# Patient Record
Sex: Female | Born: 1950
Health system: Southern US, Community
[De-identification: ages and names within clinical notes are randomized; demographics above are authoritative.]

## PROBLEM LIST (undated history)

## (undated) DIAGNOSIS — I10 Essential (primary) hypertension: Secondary | ICD-10-CM

## (undated) DIAGNOSIS — H9193 Unspecified hearing loss, bilateral: Secondary | ICD-10-CM

## (undated) DIAGNOSIS — E119 Type 2 diabetes mellitus without complications: Secondary | ICD-10-CM

## (undated) HISTORY — PX: MASTECTOMY: SHX3

## (undated) HISTORY — DX: Unspecified hearing loss, bilateral: H91.93

---

## 1999-06-18 ENCOUNTER — Encounter: Admission: RE | Admit: 1999-06-18 | Discharge: 1999-06-18 | Payer: Self-pay | Admitting: Surgery

## 1999-06-18 ENCOUNTER — Encounter: Payer: Self-pay | Admitting: Surgery

## 1999-07-05 ENCOUNTER — Ambulatory Visit (HOSPITAL_BASED_OUTPATIENT_CLINIC_OR_DEPARTMENT_OTHER): Admission: RE | Admit: 1999-07-05 | Discharge: 1999-07-05 | Payer: Self-pay | Admitting: Surgery

## 1999-07-11 ENCOUNTER — Ambulatory Visit (HOSPITAL_COMMUNITY): Admission: RE | Admit: 1999-07-11 | Discharge: 1999-07-11 | Payer: Self-pay | Admitting: Surgery

## 1999-07-11 ENCOUNTER — Encounter: Payer: Self-pay | Admitting: Surgery

## 1999-07-11 ENCOUNTER — Other Ambulatory Visit: Admission: RE | Admit: 1999-07-11 | Discharge: 1999-07-11 | Payer: Self-pay | Admitting: Surgery

## 1999-07-31 ENCOUNTER — Ambulatory Visit: Admission: RE | Admit: 1999-07-31 | Discharge: 1999-07-31 | Payer: Self-pay | Admitting: Gynecology

## 1999-08-16 ENCOUNTER — Encounter: Payer: Self-pay | Admitting: Gynecologic Oncology

## 1999-08-21 ENCOUNTER — Encounter (INDEPENDENT_AMBULATORY_CARE_PROVIDER_SITE_OTHER): Payer: Self-pay

## 1999-08-21 ENCOUNTER — Ambulatory Visit (HOSPITAL_COMMUNITY): Admission: RE | Admit: 1999-08-21 | Discharge: 1999-08-21 | Payer: Self-pay | Admitting: Gynecologic Oncology

## 1999-09-04 ENCOUNTER — Ambulatory Visit: Admission: RE | Admit: 1999-09-04 | Discharge: 1999-09-04 | Payer: Self-pay | Admitting: Gynecologic Oncology

## 1999-10-31 ENCOUNTER — Ambulatory Visit: Admission: RE | Admit: 1999-10-31 | Discharge: 1999-10-31 | Payer: Self-pay | Admitting: Gynecologic Oncology

## 2000-10-01 ENCOUNTER — Other Ambulatory Visit: Admission: RE | Admit: 2000-10-01 | Discharge: 2000-10-01 | Payer: Self-pay | Admitting: *Deleted

## 2001-12-10 ENCOUNTER — Other Ambulatory Visit: Admission: RE | Admit: 2001-12-10 | Discharge: 2001-12-10 | Payer: Self-pay | Admitting: Obstetrics and Gynecology

## 2004-05-31 ENCOUNTER — Encounter: Admission: RE | Admit: 2004-05-31 | Discharge: 2004-05-31 | Payer: Self-pay | Admitting: Family Medicine

## 2005-09-29 ENCOUNTER — Emergency Department (HOSPITAL_COMMUNITY): Admission: EM | Admit: 2005-09-29 | Discharge: 2005-09-29 | Payer: Self-pay | Admitting: Emergency Medicine

## 2005-12-07 ENCOUNTER — Encounter: Admission: RE | Admit: 2005-12-07 | Discharge: 2005-12-07 | Payer: Self-pay | Admitting: Orthopedic Surgery

## 2005-12-09 ENCOUNTER — Encounter: Admission: RE | Admit: 2005-12-09 | Discharge: 2006-03-09 | Payer: Self-pay | Admitting: Orthopedic Surgery

## 2006-03-10 ENCOUNTER — Encounter: Admission: RE | Admit: 2006-03-10 | Discharge: 2006-03-25 | Payer: Self-pay | Admitting: Orthopedic Surgery

## 2006-10-06 ENCOUNTER — Encounter: Admission: RE | Admit: 2006-10-06 | Discharge: 2006-10-06 | Payer: Self-pay | Admitting: Family Medicine

## 2006-11-11 ENCOUNTER — Other Ambulatory Visit: Admission: RE | Admit: 2006-11-11 | Discharge: 2006-11-11 | Payer: Self-pay | Admitting: Obstetrics and Gynecology

## 2006-11-11 ENCOUNTER — Encounter: Admission: RE | Admit: 2006-11-11 | Discharge: 2006-11-11 | Payer: Self-pay | Admitting: Family Medicine

## 2006-11-19 ENCOUNTER — Encounter: Admission: RE | Admit: 2006-11-19 | Discharge: 2006-11-19 | Payer: Self-pay | Admitting: Family Medicine

## 2006-11-19 ENCOUNTER — Encounter (INDEPENDENT_AMBULATORY_CARE_PROVIDER_SITE_OTHER): Payer: Self-pay | Admitting: Diagnostic Radiology

## 2006-12-03 ENCOUNTER — Encounter: Admission: RE | Admit: 2006-12-03 | Discharge: 2006-12-03 | Payer: Self-pay | Admitting: Family Medicine

## 2006-12-25 ENCOUNTER — Encounter: Admission: RE | Admit: 2006-12-25 | Discharge: 2006-12-25 | Payer: Self-pay | Admitting: Surgery

## 2006-12-25 ENCOUNTER — Encounter (INDEPENDENT_AMBULATORY_CARE_PROVIDER_SITE_OTHER): Payer: Self-pay | Admitting: Surgery

## 2006-12-25 ENCOUNTER — Ambulatory Visit (HOSPITAL_BASED_OUTPATIENT_CLINIC_OR_DEPARTMENT_OTHER): Admission: RE | Admit: 2006-12-25 | Discharge: 2006-12-25 | Payer: Self-pay | Admitting: Surgery

## 2007-01-06 ENCOUNTER — Ambulatory Visit: Payer: Self-pay | Admitting: Oncology

## 2007-01-13 LAB — CBC WITH DIFFERENTIAL/PLATELET
BASO%: 0.5 % (ref 0.0–2.0)
Eosinophils Absolute: 0.1 10*3/uL (ref 0.0–0.5)
LYMPH%: 29.5 % (ref 14.0–48.0)
MCHC: 35.3 g/dL (ref 32.0–36.0)
MONO#: 0.3 10*3/uL (ref 0.1–0.9)
NEUT#: 3 10*3/uL (ref 1.5–6.5)
Platelets: 228 10*3/uL (ref 145–400)
RBC: 4.56 10*6/uL (ref 3.70–5.32)
WBC: 4.8 10*3/uL (ref 3.9–10.0)
lymph#: 1.4 10*3/uL (ref 0.9–3.3)

## 2007-01-13 LAB — COMPREHENSIVE METABOLIC PANEL
ALT: 13 U/L (ref 0–35)
Albumin: 4.2 g/dL (ref 3.5–5.2)
CO2: 27 mEq/L (ref 19–32)
Calcium: 9.4 mg/dL (ref 8.4–10.5)
Chloride: 102 mEq/L (ref 96–112)
Glucose, Bld: 112 mg/dL — ABNORMAL HIGH (ref 70–99)
Potassium: 3.7 mEq/L (ref 3.5–5.3)
Sodium: 140 mEq/L (ref 135–145)
Total Bilirubin: 0.4 mg/dL (ref 0.3–1.2)
Total Protein: 7.5 g/dL (ref 6.0–8.3)

## 2007-01-14 ENCOUNTER — Ambulatory Visit: Admission: RE | Admit: 2007-01-14 | Discharge: 2007-03-25 | Payer: Self-pay | Admitting: Radiation Oncology

## 2007-02-27 ENCOUNTER — Ambulatory Visit: Payer: Self-pay | Admitting: Oncology

## 2007-05-21 ENCOUNTER — Ambulatory Visit: Payer: Self-pay | Admitting: Oncology

## 2007-05-21 LAB — CBC WITH DIFFERENTIAL/PLATELET
BASO%: 0.5 % (ref 0.0–2.0)
Eosinophils Absolute: 0.1 10*3/uL (ref 0.0–0.5)
LYMPH%: 32.5 % (ref 14.0–48.0)
MCHC: 34.9 g/dL (ref 32.0–36.0)
MONO#: 0.6 10*3/uL (ref 0.1–0.9)
NEUT#: 4.4 10*3/uL (ref 1.5–6.5)
Platelets: 219 10*3/uL (ref 145–400)
RBC: 4.78 10*6/uL (ref 3.70–5.32)
RDW: 12.9 % (ref 11.3–14.5)
WBC: 7.5 10*3/uL (ref 3.9–10.0)

## 2007-05-21 LAB — COMPREHENSIVE METABOLIC PANEL
ALT: 13 U/L (ref 0–35)
Albumin: 4.2 g/dL (ref 3.5–5.2)
Alkaline Phosphatase: 52 U/L (ref 39–117)
CO2: 23 mEq/L (ref 19–32)
Potassium: 3.6 mEq/L (ref 3.5–5.3)
Sodium: 138 mEq/L (ref 135–145)
Total Bilirubin: 0.4 mg/dL (ref 0.3–1.2)
Total Protein: 7.8 g/dL (ref 6.0–8.3)

## 2007-08-11 ENCOUNTER — Ambulatory Visit: Payer: Self-pay | Admitting: Oncology

## 2007-08-21 LAB — CBC WITH DIFFERENTIAL/PLATELET
Basophils Absolute: 0 10*3/uL (ref 0.0–0.1)
EOS%: 1.6 % (ref 0.0–7.0)
Eosinophils Absolute: 0.1 10*3/uL (ref 0.0–0.5)
HCT: 37.2 % (ref 34.8–46.6)
HGB: 13.2 g/dL (ref 11.6–15.9)
MCH: 31.7 pg (ref 26.0–34.0)
MONO#: 0.4 10*3/uL (ref 0.1–0.9)
NEUT#: 2.6 10*3/uL (ref 1.5–6.5)
NEUT%: 52.8 % (ref 39.6–76.8)
lymph#: 1.8 10*3/uL (ref 0.9–3.3)

## 2007-08-21 LAB — COMPREHENSIVE METABOLIC PANEL
Albumin: 3.9 g/dL (ref 3.5–5.2)
BUN: 14 mg/dL (ref 6–23)
CO2: 27 mEq/L (ref 19–32)
Calcium: 9.2 mg/dL (ref 8.4–10.5)
Chloride: 102 mEq/L (ref 96–112)
Creatinine, Ser: 1.17 mg/dL (ref 0.40–1.20)
Glucose, Bld: 135 mg/dL — ABNORMAL HIGH (ref 70–99)
Potassium: 3.8 mEq/L (ref 3.5–5.3)

## 2007-08-21 LAB — CANCER ANTIGEN 27.29: CA 27.29: 11 U/mL (ref 0–39)

## 2007-08-27 LAB — VITAMIN D 25 HYDROXY (VIT D DEFICIENCY, FRACTURES): Vit D, 25-Hydroxy: 45 ng/mL (ref 30–89)

## 2007-08-27 LAB — TSH: TSH: 4.932 u[IU]/mL (ref 0.350–5.500)

## 2007-09-01 LAB — VITAMIN D 1,25 DIHYDROXY: Vit D, 1,25-Dihydroxy: 35 pg/mL (ref 15–75)

## 2007-11-23 ENCOUNTER — Ambulatory Visit: Payer: Self-pay | Admitting: Oncology

## 2007-12-02 ENCOUNTER — Encounter: Admission: RE | Admit: 2007-12-02 | Discharge: 2007-12-02 | Payer: Self-pay | Admitting: Oncology

## 2007-12-22 ENCOUNTER — Other Ambulatory Visit: Admission: RE | Admit: 2007-12-22 | Discharge: 2007-12-22 | Payer: Self-pay | Admitting: Obstetrics and Gynecology

## 2008-03-11 ENCOUNTER — Ambulatory Visit: Payer: Self-pay | Admitting: Oncology

## 2008-06-13 ENCOUNTER — Ambulatory Visit: Payer: Self-pay | Admitting: Oncology

## 2008-07-22 LAB — CBC WITH DIFFERENTIAL/PLATELET
BASO%: 0.7 % (ref 0.0–2.0)
Basophils Absolute: 0 10*3/uL (ref 0.0–0.1)
HGB: 13.9 g/dL (ref 11.6–15.9)
LYMPH%: 38.2 % (ref 14.0–49.7)
MCH: 32.1 pg (ref 25.1–34.0)
MCHC: 35.2 g/dL (ref 31.5–36.0)
MCV: 91.1 fL (ref 79.5–101.0)
NEUT#: 2.4 10*3/uL (ref 1.5–6.5)
NEUT%: 48.9 % (ref 38.4–76.8)
RDW: 12.6 % (ref 11.2–14.5)
WBC: 5 10*3/uL (ref 3.9–10.3)
lymph#: 1.9 10*3/uL (ref 0.9–3.3)

## 2008-07-22 LAB — COMPREHENSIVE METABOLIC PANEL
ALT: 16 U/L (ref 0–35)
Alkaline Phosphatase: 45 U/L (ref 39–117)
Total Bilirubin: 0.5 mg/dL (ref 0.3–1.2)

## 2008-07-25 LAB — CANCER ANTIGEN 27.29: CA 27.29: 7 U/mL (ref 0–39)

## 2008-07-25 LAB — VITAMIN D 25 HYDROXY (VIT D DEFICIENCY, FRACTURES): Vit D, 25-Hydroxy: 30 ng/mL (ref 30–89)

## 2008-12-02 ENCOUNTER — Encounter: Admission: RE | Admit: 2008-12-02 | Discharge: 2008-12-02 | Payer: Self-pay | Admitting: Oncology

## 2009-07-13 ENCOUNTER — Ambulatory Visit: Payer: Self-pay | Admitting: Oncology

## 2009-08-14 ENCOUNTER — Ambulatory Visit: Payer: Self-pay | Admitting: Oncology

## 2009-09-06 LAB — URINALYSIS, MICROSCOPIC - CHCC
Bilirubin (Urine): NEGATIVE
Ketones: NEGATIVE mg/dL
Nitrite: NEGATIVE
Protein: NEGATIVE mg/dL
pH: 7 (ref 4.6–8.0)

## 2009-09-06 LAB — CBC WITH DIFFERENTIAL/PLATELET
EOS%: 0.9 % (ref 0.0–7.0)
Eosinophils Absolute: 0.1 10*3/uL (ref 0.0–0.5)
MCHC: 34.4 g/dL (ref 31.5–36.0)
MCV: 92.2 fL (ref 79.5–101.0)
Platelets: 217 10*3/uL (ref 145–400)
RBC: 4.56 10*6/uL (ref 3.70–5.45)

## 2009-09-07 LAB — COMPREHENSIVE METABOLIC PANEL
Albumin: 4.3 g/dL (ref 3.5–5.2)
Alkaline Phosphatase: 61 U/L (ref 39–117)
Chloride: 102 mEq/L (ref 96–112)
Glucose, Bld: 104 mg/dL — ABNORMAL HIGH (ref 70–99)
Potassium: 3.6 mEq/L (ref 3.5–5.3)

## 2009-09-07 LAB — URINE CULTURE

## 2009-09-07 LAB — CANCER ANTIGEN 27.29: CA 27.29: 11 U/mL (ref 0–39)

## 2009-11-08 ENCOUNTER — Encounter: Admission: RE | Admit: 2009-11-08 | Discharge: 2009-11-08 | Payer: Self-pay | Admitting: Oncology

## 2010-05-26 ENCOUNTER — Encounter: Payer: Self-pay | Admitting: Family Medicine

## 2010-05-27 ENCOUNTER — Encounter: Payer: Self-pay | Admitting: Oncology

## 2010-09-18 NOTE — Op Note (Signed)
NAMETAUSHA, MILHOAN                   ACCOUNT NO.:  [id number]   MEDICAL RECORD NO.:  [id number]          PATIENT TYPE:  AMB   LOCATION:  DSC                          FACILITY:  [facility]   PHYSICIAN:  Sandria Bales. Ezzard Standing, M.D.  DATE OF BIRTH:  05-25-1950   DATE OF PROCEDURE:  12/25/2006  DATE OF DISCHARGE:                               OPERATIVE REPORT   PREOPERATIVE DIAGNOSIS:  Ductal carcinoma in situ of the right breast at  the 11 o'clock position.   POSTOPERATIVE DIAGNOSIS:  Ductal carcinoma in situ of the right breast  at the 11 o'clock position.   PROCEDURE:  Wire localization of right breast with right breast  lumpectomy.   SURGEON:  Sandria Bales. Ezzard Standing, M.D.   No first assistant.   ANESTHESIA:  General endotracheal with laryngeal mask airway and 3 mL of  0.25% Marcaine.   COMPLICATIONS:  None.   INDICATIONS FOR PROCEDURE:  Ms. Hebb is a 60 year old white female who  is a patient of Rosanne Ashing Tomlison of [place] who has had a recent  mammogram showing microcalcifications upper outer quadrant of the right  breast.  She underwent a core biopsy which showed a ductal carcinoma in  situ.  She had an MRI which suggested this area of her breast may be  somewhat larger such as 3 cm and what was imaged on mammography and she  now comes for lumpectomy to excise this area of cancer.   The indications and potential complications were explained to the  patient.  The potential complications are bleeding, infection, inability  to excise the entire tumor and the possibility of recurrence of the  tumor.   The patient is deaf and this was done both in direct visually reading my  lips and with an interpreter.   OPERATIVE NOTE:  The patient had 2 needles placed in her right breast at about the 11:30  and 12 o'clock position coming out cranial from the areola.  These were  placed at the breast center.  It appeared that the tumors which were  being marked were at about the 11 to 11:30 position  and the right breast  may be 2 or 3 cm off the areola.   She presented to the OR where she had a general anesthesia.  A time out  was held identifying the patient and procedure.  I prepped the breast  with betadine and sterilely draped the breast.  I made an elliptical  incision including skin and then excised the breast down to the chest  wall.  I took out a block of breast tissue approximately 7 or 8 cm in  diameter which included both wires.   I then shot a specimen mammograms using the Faxitron mammography machine  and I spoke to Dr. Mickie Hillier regarding the specimen.  We confirmed  both wires were in the specimen as were the clip and  microcalcifications.   Because of the size of the specimen and the findings, we decided to go  and just do permanent analysis of the specimen without even trying touch  preps.  There was no gross or obvious disease that I thought I cut  through during my resection.   I then irrigated the wound with saline, closed the subcutaneous tissues  with 3-0 Vicryl suture, the skin with a 5-0 Vicryl suture, painted the  wound with tincture of benzoin and Steri-Stripped it.   The patient tolerated the procedure well, was transported to the  recovery room in good condition.  Sponge and needle count were correct  at the end of the case.      Sandria Bales. Ezzard Standing, M.D.  Electronically Signed     DHN/MEDQ  D:  12/25/2006  T:  12/25/2006  Job:  [id number]   cc:   Rosine Abe, PA  Ladona Horns. Mariel Sleet, MD

## 2010-09-21 NOTE — H&P (Signed)
[facility]  Patient:    Alyssa Mitchell, Alyssa Mitchell                          MRN: [id number] Adm. Date:  [id number] Disc. Date: [id number] Attending:  Ronita Hipps [place]unn[place]liff CC:         Willey Blade, M.D.             Abigail Miyamoto, M.D.             Telford Nab, N.P. - Gyn Oncology Program - [facility]                         History and Physical  CHIEF COMPLAINT:  Ms. Villacres returns for a re-evaluation of her vulva.  HISTORY OF PRESENT ILLNESS:  She underwent a vulvar exploration on August 21, 1999, with evacuation of a benign organizing hematoma and fragments of synthetic mesh, without any significant pathology identified.  She has had progressive healing nd notes marked improvement in her vulvar sensation at rest, exercise, and during intercourse.  She has no questions and appears quite happy.  She does note that she has begun regular menses since surgery.  She and her husband are considering a vasectomy.  PHYSICAL EXAMINATION:  VITAL SIGNS:  Stable and afebrile.  EXTREMITIES:  Left groin reveals a well-healed incision.  There is minimal induration under the incision.  GENITOURINARY:  Examination of the vulva reveals a well-healed suture line, very cosmetic, inapparent amongst the pubic hair.  There is on underlying mass or induration and no tenderness.  ASSESSMENT:  Vulvar cyst or hematoma resolved.  PLAN:  The patient is released from followup from our clinic.  She will follow p with Dr. Willey Blade, for routine examinations on an annual basis.  We discussed contraceptive options, and she and her husband may well pursue vasectomy. DD:  10/31/99 TD:  10/31/99 Job: [id number]

## 2010-09-21 NOTE — Discharge Summary (Signed)
[facility]  Patient:    Alyssa Mitchell, Alyssa Mitchell                          MRN: [id number] Adm. Date:  [id number] Disc. Date: [id number] Attending:  Ronita Hipps Tunnicliff CC:         Willey Blade, M.D.             Abigail Miyamoto, M.D.             Kerby Moors, N.N. G1 Oncology Program [facility]                           Discharge Summary  HISTORY:  [name] returns for reevaluation of her vulva.  Since she was last seen by Dr. Stanford Breed one week ago, she has noted continued slight discomfort and swelling of the left vulva.  She is on a course of antibiotics for urinary tract infection and notes cough, slight hoarseness, and dysphagia, following intubation for her vulvar surgery.  It should be noted that she had probable laryngeal edema, treated by anesthesia in the recovery room and was released by them on the day f surgery.  It was expected that her symptoms of dysphagia and cough would improve. She is using steroid ointment along the incision line.  Exam:  Vital signs stable and afebrile.  There is minimal edema of the left vulva. The suture line is intact.  There is deep induration medially adjacent to her inguinal herniorrhaphy mesh.  There is a small patch of denuded skin in the medial left labia.  There is no underlying cystic component.  Pathology reveals benign organizing blood clot associated with fibroadipose tissue and synthetic mesh with inflammation, but no cyst wall identified.  ASSESSMENT:  Convalescing from second surgery in the same anatomic region.  By examination, she is making satisfactory progress.  Persistent dysphagia and cough, probably associated with laryngeal edema, relatively mildly symptomatic.  I again recommended that the patient use local heat and ibuprofen for her swelling. We discussed limitations to activities and I recommended that she return to work when she is able to sit and stand for prolonged periods of time,  which I would anticipate would take place within the next couple of weeks.  I would like to see her for followup in approximately 6-8 weeks, when she is completed healing. She was basically reassured regarding her recurrent status.  I recommended that if her symptoms of laryngeal edema do not improve that she be seen by an ENT physician and she was given several names and telephone numbers.  DD:  09/04/99 TD:  09/04/99 Job: [id number]

## 2010-09-21 NOTE — Op Note (Signed)
[place]  Patient:    [name], [name]                          MRN: [id number] Proc. Date: 07/05/99 Adm. Date:  [id number] Disc. Date: [id number] Attending:  Abigail Miyamoto A                           Operative Report  PREOPERATIVE DIAGNOSIS:  Left inguinal hernia.  POSTOPERATIVE DIAGNOSIS:  Left inguinal hernia.  OPERATION PERFORMED:  Left inguinal hernia repair.  SURGEON:  Abigail Miyamoto, M.D.  ANESTHESIA:  General endotracheal and 0.25% Marcaine plain.  INDICATIONS FOR PROCEDURE:  Ms. Alyssa Mitchell is a very pleasant 60 year old who presented with a bulge in her left labial area.  This area was easily reducible on physical examination.  She had been seen by her gynecologist who felt this represented a hernia rather than a cyst of some sort.  Preoperatively, the patient had an ultrasound performed of the groin which demonstrated findings consistent  with a loop of bowel and an inguinal hernia.  No specific cyst was identified.  Therefore a decision was made to proceed to the operating room for groin exploration and hernia repair.  DESCRIPTION OF PROCEDURE:  The patient was brought to the operating room and identified at [facility].  She was placed supine on the operating table and general anesthesia was induced.  Her abdomen was then prepped and draped in the usual sterile fashion.  Using a #10 blade, a small longitudinal incision was made in he patients right groin.  The incision was carried down through Scarpas fascia with the electrocautery.  External oblique fascia was then identified and opened completely.  The groin anatomy was identified including the round ligament. There was a question of a hernia tract going through the weakened inguinal floor and nto the labial area; however, this was difficult to ascertain and no specific loop f bowel was identified.  At this point, the ligament was transected and the proximal limb was  sewn off with silk suture.  Next, a piece of Prolene mesh was brought nto the field and fashioned appropriately.  It was then sewn into the inguinal area by tacking it to the pubic tubercle and sewing it around the transversalis fascia nd then along the shelving edge of the inguinal ligament.  A separate area was fashioned to go into the labial area covering any defect in that area as well. At this point once the mesh was sewn in place, the groin was irrigated with normal  saline.  The external oblique fascia was then closed with a running 2-0 Vicryl suture.  Scarpas fascia was then closed with interrupted 3-0  Vicryl sutures and the skin was closed with a running 4-0 Vicryl suture.  Steri-Strips, gauze and ape were then applied.  The patient tolerated the procedure well.  All sponge, needle and instrument counts were correct at the end of the procedure.  The patient was then extubated in the operating room and taken in stable condition to the recovery room. DD:  07/30/99 TD:  07/30/99 Job: [id number]

## 2011-02-15 LAB — I-STAT 8, (EC8 V) (CONVERTED LAB)
BUN: 11
Bicarbonate: 29 — ABNORMAL HIGH
Glucose, Bld: 115 — ABNORMAL HIGH
Hemoglobin: 15.3 — ABNORMAL HIGH
Sodium: 139
pH, Ven: 7.365 — ABNORMAL HIGH

## 2012-06-05 ENCOUNTER — Other Ambulatory Visit: Payer: Self-pay | Admitting: Physician Assistant

## 2012-06-05 DIAGNOSIS — Z853 Personal history of malignant neoplasm of breast: Secondary | ICD-10-CM

## 2012-09-16 ENCOUNTER — Ambulatory Visit
Admission: RE | Admit: 2012-09-16 | Discharge: 2012-09-16 | Disposition: A | Discharge status: Home / Self Care | Payer: Medicare Other | Source: Ambulatory Visit | Attending: Physician Assistant | Admitting: Physician Assistant

## 2012-09-16 DIAGNOSIS — Z853 Personal history of malignant neoplasm of breast: Secondary | ICD-10-CM

## 2014-12-13 ENCOUNTER — Emergency Department (HOSPITAL_COMMUNITY): Payer: Federal, State, Local not specified - PPO

## 2014-12-13 ENCOUNTER — Emergency Department (HOSPITAL_COMMUNITY)
Admission: EM | Admit: 2014-12-13 | Discharge: 2014-12-14 | Disposition: A | Discharge status: Home / Self Care | Payer: Federal, State, Local not specified - PPO | Attending: Emergency Medicine | Admitting: Emergency Medicine

## 2014-12-13 ENCOUNTER — Encounter (HOSPITAL_COMMUNITY): Payer: Self-pay | Admitting: Physical Medicine and Rehabilitation

## 2014-12-13 DIAGNOSIS — E119 Type 2 diabetes mellitus without complications: Secondary | ICD-10-CM | POA: Insufficient documentation

## 2014-12-13 DIAGNOSIS — R05 Cough: Secondary | ICD-10-CM | POA: Insufficient documentation

## 2014-12-13 DIAGNOSIS — R109 Unspecified abdominal pain: Secondary | ICD-10-CM

## 2014-12-13 DIAGNOSIS — R1032 Left lower quadrant pain: Secondary | ICD-10-CM | POA: Diagnosis present

## 2014-12-13 DIAGNOSIS — R1084 Generalized abdominal pain: Secondary | ICD-10-CM | POA: Diagnosis not present

## 2014-12-13 DIAGNOSIS — N200 Calculus of kidney: Secondary | ICD-10-CM | POA: Diagnosis not present

## 2014-12-13 DIAGNOSIS — K5792 Diverticulitis of intestine, part unspecified, without perforation or abscess without bleeding: Secondary | ICD-10-CM | POA: Diagnosis not present

## 2014-12-13 DIAGNOSIS — I10 Essential (primary) hypertension: Secondary | ICD-10-CM | POA: Diagnosis not present

## 2014-12-13 DIAGNOSIS — K760 Fatty (change of) liver, not elsewhere classified: Secondary | ICD-10-CM | POA: Diagnosis not present

## 2014-12-13 DIAGNOSIS — R059 Cough, unspecified: Secondary | ICD-10-CM

## 2014-12-13 DIAGNOSIS — N390 Urinary tract infection, site not specified: Secondary | ICD-10-CM | POA: Insufficient documentation

## 2014-12-13 DIAGNOSIS — Z79899 Other long term (current) drug therapy: Secondary | ICD-10-CM | POA: Insufficient documentation

## 2014-12-13 DIAGNOSIS — K5732 Diverticulitis of large intestine without perforation or abscess without bleeding: Secondary | ICD-10-CM | POA: Diagnosis not present

## 2014-12-13 HISTORY — DX: Essential (primary) hypertension: I10

## 2014-12-13 HISTORY — DX: Type 2 diabetes mellitus without complications: E11.9

## 2014-12-13 LAB — COMPREHENSIVE METABOLIC PANEL
ALK PHOS: 68 U/L (ref 38–126)
ALT: 16 U/L (ref 14–54)
ANION GAP: 13 (ref 5–15)
AST: 21 U/L (ref 15–41)
Albumin: 3.7 g/dL (ref 3.5–5.0)
BUN: 11 mg/dL (ref 6–20)
CHLORIDE: 97 mmol/L — AB (ref 101–111)
CO2: 24 mmol/L (ref 22–32)
CREATININE: 0.98 mg/dL (ref 0.44–1.00)
Calcium: 9.9 mg/dL (ref 8.9–10.3)
GFR calc Af Amer: >60 mL/min (ref >60)
GLUCOSE: 113 mg/dL — AB (ref 65–99)
POTASSIUM: 4 mmol/L (ref 3.5–5.1)
SODIUM: 134 mmol/L — AB (ref 135–145)
Total Bilirubin: 0.8 mg/dL (ref 0.3–1.2)
Total Protein: 8.3 g/dL — ABNORMAL HIGH (ref 6.5–8.1)

## 2014-12-13 LAB — CBC WITH DIFFERENTIAL/PLATELET
BASOS PCT: 0 % (ref 0–1)
Basophils Absolute: 0 10*3/uL (ref 0.0–0.1)
EOS ABS: 0.1 10*3/uL (ref 0.0–0.7)
EOS PCT: 2 % (ref 0–5)
HEMATOCRIT: 41.4 % (ref 36.0–46.0)
HEMOGLOBIN: 14.3 g/dL (ref 12.0–15.0)
LYMPHS ABS: 2.6 10*3/uL (ref 0.7–4.0)
LYMPHS PCT: 32 % (ref 12–46)
MCH: 30.6 pg (ref 26.0–34.0)
MCHC: 34.5 g/dL (ref 30.0–36.0)
MCV: 88.5 fL (ref 78.0–100.0)
MONOS PCT: 8 % (ref 3–12)
Monocytes Absolute: 0.6 10*3/uL (ref 0.1–1.0)
NEUTROS PCT: 58 % (ref 43–77)
Neutro Abs: 4.7 10*3/uL (ref 1.7–7.7)
PLATELETS: 285 10*3/uL (ref 150–400)
RBC: 4.68 MIL/uL (ref 3.87–5.11)
RDW: 12.6 % (ref 11.5–15.5)
WBC: 8 10*3/uL (ref 4.0–10.5)

## 2014-12-13 LAB — URINE MICROSCOPIC-ADD ON

## 2014-12-13 LAB — URINALYSIS, ROUTINE W REFLEX MICROSCOPIC
Bilirubin Urine: NEGATIVE
GLUCOSE, UA: NEGATIVE mg/dL
Hgb urine dipstick: NEGATIVE
Ketones, ur: NEGATIVE mg/dL
Nitrite: NEGATIVE
PROTEIN: NEGATIVE mg/dL
SPECIFIC GRAVITY, URINE: 1.02 (ref 1.005–1.030)
Urobilinogen, UA: 1 mg/dL (ref 0.0–1.0)
pH: 6 (ref 5.0–8.0)

## 2014-12-13 LAB — LIPASE, BLOOD: Lipase: 32 U/L (ref 22–51)

## 2014-12-13 MED ORDER — MORPHINE SULFATE 4 MG/ML IJ SOLN
4.0000 mg | Freq: Once | INTRAMUSCULAR | Status: AC
  Administered 2014-12-13: 4 mg via INTRAVENOUS
  Filled 2014-12-13: qty 1

## 2014-12-13 MED ORDER — ONDANSETRON HCL 4 MG/2ML IJ SOLN
4.0000 mg | Freq: Once | INTRAMUSCULAR | Status: AC
  Administered 2014-12-13: 4 mg via INTRAVENOUS
  Filled 2014-12-13: qty 2

## 2014-12-13 MED ORDER — IOHEXOL 300 MG/ML  SOLN
100.0000 mL | Freq: Once | INTRAMUSCULAR | Status: AC | PRN
  Administered 2014-12-13: 100 mL via INTRAVENOUS

## 2014-12-13 MED ORDER — CIPROFLOXACIN HCL 500 MG PO TABS
500.0000 mg | ORAL_TABLET | Freq: Once | ORAL | Status: AC
  Administered 2014-12-14: 500 mg via ORAL
  Filled 2014-12-13: qty 1

## 2014-12-13 MED ORDER — CIPROFLOXACIN 500 MG/5ML (10%) PO SUSR: 500.0000 mg | Freq: Once | ORAL | Status: DC

## 2014-12-13 MED ORDER — ONDANSETRON 4 MG PO TBDP: 4.0000 mg | ORAL_TABLET | Freq: Three times a day (TID) | ORAL | Status: DC | PRN

## 2014-12-13 MED ORDER — METRONIDAZOLE 500 MG PO TABS
500.0000 mg | ORAL_TABLET | Freq: Once | ORAL | Status: AC
  Administered 2014-12-14: 500 mg via ORAL
  Filled 2014-12-13: qty 1

## 2014-12-13 MED ORDER — METRONIDAZOLE 500 MG PO TABS: 500.0000 mg | ORAL_TABLET | Freq: Four times a day (QID) | ORAL | Status: DC

## 2014-12-13 MED ORDER — CIPROFLOXACIN 500 MG/5ML (10%) PO SUSR: 500.0000 mg | Freq: Two times a day (BID) | ORAL | Status: DC

## 2014-12-13 MED ORDER — IOHEXOL 300 MG/ML  SOLN: 25.0000 mL | Freq: Once | INTRAMUSCULAR | Status: DC | PRN

## 2014-12-13 MED ORDER — SODIUM CHLORIDE 0.9 % IV BOLUS (SEPSIS)
1000.0000 mL | Freq: Once | INTRAVENOUS | Status: AC
  Administered 2014-12-13: 1000 mL via INTRAVENOUS

## 2014-12-13 NOTE — ED Notes (Signed)
Pt presents to department for evaluation of L sided abdominal pain radiating to lower back. Ongoing x1 week. Denies urinary symptoms. Pt is alert and oriented x4.

## 2014-12-13 NOTE — ED Provider Notes (Signed)
CSN: [id number]     Arrival date & time 12/13/14  1417 History   First MD Initiated Contact with Patient 12/13/14 1851     Chief Complaint  Patient presents with  . Abdominal Pain     (Consider location/radiation/quality/duration/timing/severity/associated sxs/prior Treatment) HPI    PCP: No primary care provider on file. Blood pressure 148/70, pulse 73, temperature 98 F (36.7 C), temperature source Oral, resp. rate 18, SpO2 95 %.  Alyssa Mitchell is a 64 y.o.female with a significant PMH of hypertension and diabetes.  LO is a 64 yo female with a PMh of HTN, DM and deafness who presents with left sided flank and lower abdominal pain x 1 week. The flank pain wraps around her side into the LLQ. It is minimally alleviated with gas or BM, but denies trying any medical treatment. She drinks multiple soft drinks per day. She denies a change in frequency, consistency or stool color. She denies urinary frequency, hesitancy or dysuria, fever. She was treated by her PCP last week with Bactrim DS without doing a urinalysis. The Bactrim has not alleviated the pain. She has a nonproductive cough and she denies SOB or chest pain. Pt reports not wanting to be evaluated for the cough today because she is not concerned with it.  The patient denies diaphoresis, fever, headache, weakness (general or focal), confusion, change of vision,  neck pain, dysphagia, aphagia, chest pain, shortness of breath,  back pain, nausea, vomiting, diarrhea, lower extremity swelling, rash.  Past Medical History  Diagnosis Date  . Hypertension   . Diabetes mellitus without complication    History reviewed. No pertinent past surgical history. No family history on file. Social History  Substance Use Topics  . Smoking status: Never Smoker   . Smokeless tobacco: None  . Alcohol Use: No   OB History    No data available     Review of Systems  10 Systems reviewed and are negative for acute change except as noted in the HPI.    Allergies  Review of patient's allergies indicates no known allergies.  Home Medications   Prior to Admission medications   Medication Sig Start Date End Date Taking? Authorizing Provider  atenolol-chlorthalidone (TENORETIC) 50-25 MG per tablet Take 1 tablet by mouth daily. 11/19/14  Yes Historical Provider, MD  atorvastatin (LIPITOR) 10 MG tablet Take 10 mg by mouth at bedtime. 11/01/14  Yes Historical Provider, MD  metFORMIN (GLUCOPHAGE) 1000 MG tablet Take 500 mg by mouth 2 (two) times daily. 09/19/14  Yes Historical Provider, MD  ciprofloxacin (CIPRO) 500 MG/5ML (10%) suspension Take 5 mLs (500 mg total) by mouth 2 (two) times daily. 12/13/14   Tayshun Gappa Carlota Raspberry, PA-C  metroNIDAZOLE (FLAGYL) 500 MG tablet Take 1 tablet (500 mg total) by mouth 4 (four) times daily. 12/13/14   Daizha Anand Carlota Raspberry, PA-C  ondansetron (ZOFRAN ODT) 4 MG disintegrating tablet Take 1 tablet (4 mg total) by mouth every 8 (eight) hours as needed for nausea or vomiting. 12/13/14   Hailea Eaglin Carlota Raspberry, PA-C   BP 152/65 mmHg  Pulse 69  Temp(Src) 98 F (36.7 C) (Oral)  Resp 18  SpO2 93% Physical Exam  Constitutional: She appears well-developed and well-nourished. No distress.  HENT:  Head: Normocephalic and atraumatic.  Eyes: Pupils are equal, round, and reactive to light.  Neck: Normal range of motion. Neck supple.  Cardiovascular: Normal rate and regular rhythm.   Pulmonary/Chest: Effort normal.  Abdominal: Soft. Bowel sounds are normal. She exhibits distension. There is tenderness (diffuse  mild ) in the periumbilical area and suprapubic area. There is no rebound, no guarding and no CVA tenderness.  Neurological: She is alert.  Skin: Skin is warm and dry.  Nursing note and vitals reviewed.   ED Course  Procedures (including critical care time) Labs Review Labs Reviewed  COMPREHENSIVE METABOLIC PANEL - Abnormal; Notable for the following:    Sodium 134 (*)    Chloride 97 (*)    Glucose, Bld 113 (*)    Total Protein  8.3 (*)    All other components within normal limits  URINALYSIS, ROUTINE W REFLEX MICROSCOPIC (NOT AT [facility]) - Abnormal; Notable for the following:    Color, Urine AMBER (*)    APPearance CLOUDY (*)    Leukocytes, UA LARGE (*)    All other components within normal limits  URINE MICROSCOPIC-ADD ON - Abnormal; Notable for the following:    Squamous Epithelial / LPF MANY (*)    Bacteria, UA FEW (*)    All other components within normal limits  URINE CULTURE  CBC WITH DIFFERENTIAL/PLATELET  LIPASE, BLOOD    Imaging Review Ct Abdomen Pelvis W Contrast  12/13/2014   CLINICAL DATA:  One week history of left lower quadrant region pain  EXAM: CT ABDOMEN AND PELVIS WITH CONTRAST  TECHNIQUE: Multidetector CT imaging of the abdomen and pelvis was performed using the standard protocol following bolus administration of intravenous contrast. Oral contrast was also administered.  CONTRAST:  160mL OMNIPAQUE IOHEXOL 300 MG/ML  SOLN  COMPARISON:  None.  FINDINGS: There is patchy bibasilar atelectasis. Lung bases are otherwise clear.  There is mild hepatic steatosis. No focal liver lesions are identified. Gallbladder wall is not appreciably thickened. There is no biliary duct dilatation.  Spleen, pancreas, and adrenals appear within normal limits.  There is a 1 x 1 cm cyst arising from the lateral left mid kidney. There is a 1.3 x 0.8 cm cyst in the anterior lower pole left kidney. There is a 7 mm area of decreased attenuation in the posterior mid right kidney which has a measured attenuation value higher than is expected with a simple cyst but does not show enhancement or increased attenuation on delayed images. This area may represent a mildly complex cyst. There is scarring in the anterior mid right kidney. There is no hydronephrosis on either side. There is a 1 mm calculus in the lower pole left kidney with a 2 mm calculus with adjacent 1 mm calculus more posterior in the lower pole left kidney. There is a 1 mm  calculus in the lower pole of the right kidney as well as several 1 mm calculi in the mid right kidney. There are no ureteral calculi on either side.  In the pelvis, the urinary bladder wall is not thickened. Uterus is anteverted.  There is no pelvic mass or pelvic fluid collection. There is wall thickening in the proximal sigmoid colon consistent with a degree of diverticulitis. There is inflammation of epiploic appendages in this region with secondary epiploic appendagitis. No abscess or microperforation is seen currently in this area.  Appendix appears normal.  No bowel obstruction.  No free air or portal venous air.  There is no ascites, adenopathy, or abscess in the abdomen or pelvis. There is atherosclerotic change in aorta but no demonstrable aneurysm. There is degenerative change in the lumbar spine with vacuum phenomenon at L5-S1. There are no blastic or lytic bone lesions.  IMPRESSION: Proximal sigmoid diverticulitis without demonstrable abscess or microperforation. There is  secondary epiploic appendagitis in this area.  Small nonobstructing calculi in each kidney. Small cysts in each kidney. There is a 7 mm area of decreased attenuation in the mid right kidney which cannot be classified as a simple cyst on this study. This finding may warrant nonemergent pre and post-contrast renal MR to further assess.  Mild hepatic steatosis.  No abscess.  No bowel obstruction.  Appendix appears normal.   Electronically Signed   By: Lowella Grip III M.D.   On: 12/13/2014 23:14     EKG Interpretation None      MDM   Final diagnoses:  Cough  Abdominal pain  Diverticulitis of intestine without perforation or abscess without bleeding  UTI (lower urinary tract infection)    Patient has possible UTI CBC, mildly abnormal CMP, pt received fluid replacement to correct electrolyte. Negative lipase Proximal sigmoid diverticulitis-- no abscess or microperforation. Benign non surgical epiploic appendagitis  is also seen here.  Otherwise the patient does not have any other acute findings. She is pain free currently in the ER. She has had no nausea, vomiting or diarrhea. No fever or weakness. She prefers home and to follow-up with PCP.  Rx: Cipro and flagyl (pt cannot swallow pills) and zofran. She declined rx for pain medication.  Medications  morphine 4 MG/ML injection 4 mg (4 mg Intravenous Given 12/13/14 2004)  ondansetron (ZOFRAN) injection 4 mg (4 mg Intravenous Given 12/13/14 2004)  sodium chloride 0.9 % bolus 1,000 mL (0 mLs Intravenous Stopped 12/14/14 0019)  iohexol (OMNIPAQUE) 300 MG/ML solution 100 mL (100 mLs Intravenous Contrast Given 12/13/14 2232)  metroNIDAZOLE (FLAGYL) tablet 500 mg (500 mg Oral Given 12/14/14 0018)  ciprofloxacin (CIPRO) tablet 500 mg (500 mg Oral Given 12/14/14 0018)    64 y.o.Shruthi M Sumpter's evaluation in the Emergency Department is complete. It has been determined that no acute conditions requiring further emergency intervention are present at this time. The patient/guardian have been advised of the diagnosis and plan. We have discussed signs and symptoms that warrant return to the ED, such as changes or worsening in symptoms.  Vital signs are stable at discharge. Filed Vitals:   12/14/14 0019  BP:   Pulse: 69  Temp:   Resp:     Patient/guardian has voiced understanding and agreed to follow-up with the PCP or specialist.    Delos Haring, PA-C 12/15/14 [place], MD 12/16/14 [phone]

## 2014-12-14 NOTE — Discharge Instructions (Signed)
Diverticulitis Diverticulitis is inflammation or infection of small pouches in your colon that form when you have a condition called diverticulosis. The pouches in your colon are called diverticula. Your colon, or large intestine, is where water is absorbed and stool is formed. Complications of diverticulitis can include:  Bleeding.  Severe infection.  Severe pain.  Perforation of your colon.  Obstruction of your colon. CAUSES  Diverticulitis is caused by bacteria. Diverticulitis happens when stool becomes trapped in diverticula. This allows bacteria to grow in the diverticula, which can lead to inflammation and infection. RISK FACTORS People with diverticulosis are at risk for diverticulitis. Eating a diet that does not include enough fiber from fruits and vegetables may make diverticulitis more likely to develop. SYMPTOMS  Symptoms of diverticulitis may include:  Abdominal pain and tenderness. The pain is normally located on the left side of the abdomen, but may occur in other areas.  Fever and chills.  Bloating.  Cramping.  Nausea.  Vomiting.  Constipation.  Diarrhea.  Blood in your stool. DIAGNOSIS  Your health care provider will ask you about your medical history and do a physical exam. You may need to have tests done because many medical conditions can cause the same symptoms as diverticulitis. Tests may include:  Blood tests.  Urine tests.  Imaging tests of the abdomen, including X-rays and CT scans. When your condition is under control, your health care provider may recommend that you have a colonoscopy. A colonoscopy can show how severe your diverticula are and whether something else is causing your symptoms. TREATMENT  Most cases of diverticulitis are mild and can be treated at home. Treatment may include:  Taking over-the-counter pain medicines.  Following a clear liquid diet.  Taking antibiotic medicines by mouth for 7-10 days. More severe cases may  be treated at a hospital. Treatment may include:  Not eating or drinking.  Taking prescription pain medicine.  Receiving antibiotic medicines through an IV tube.  Receiving fluids and nutrition through an IV tube.  Surgery. HOME CARE INSTRUCTIONS   Follow your health care provider's instructions carefully.  Follow a full liquid diet or other diet as directed by your health care provider. After your symptoms improve, your health care provider may tell you to change your diet. He or she may recommend you eat a high-fiber diet. Fruits and vegetables are good sources of fiber. Fiber makes it easier to pass stool.  Take fiber supplements or probiotics as directed by your health care provider.  Only take medicines as directed by your health care provider.  Keep all your follow-up appointments. SEEK MEDICAL CARE IF:   Your pain does not improve.  You have a hard time eating food.  Your bowel movements do not return to normal. SEEK IMMEDIATE MEDICAL CARE IF:   Your pain becomes worse.  Your symptoms do not get better.  Your symptoms suddenly get worse.  You have a fever.  You have repeated vomiting.  You have bloody or black, tarry stools. MAKE SURE YOU:   Understand these instructions.  Will watch your condition.  Will get help right away if you are not doing well or get worse. Document Released: 01/30/2005 Document Revised: 04/27/2013 Document Reviewed: 03/17/2013 [facility] Patient Information 2015 [place], [place]. This information is not intended to replace advice given to you by your health care provider. Make sure you discuss any questions you have with your health care provider.   Urinary Tract Infection Urinary tract infections (UTIs) can develop anywhere along your  urinary tract. Your urinary tract is your body's drainage system for removing wastes and extra water. Your urinary tract includes two kidneys, two ureters, a bladder, and a urethra. Your kidneys are a  pair of bean-shaped organs. Each kidney is about the size of your fist. They are located below your ribs, one on each side of your spine. CAUSES Infections are caused by microbes, which are microscopic organisms, including fungi, viruses, and bacteria. These organisms are so small that they can only be seen through a microscope. Bacteria are the microbes that most commonly cause UTIs. SYMPTOMS  Symptoms of UTIs may vary by age and gender of the patient and by the location of the infection. Symptoms in young women typically include a frequent and intense urge to urinate and a painful, burning feeling in the bladder or urethra during urination. Older women and men are more likely to be tired, shaky, and weak and have muscle aches and abdominal pain. A fever may mean the infection is in your kidneys. Other symptoms of a kidney infection include pain in your back or sides below the ribs, nausea, and vomiting. DIAGNOSIS To diagnose a UTI, your caregiver will ask you about your symptoms. Your caregiver also will ask to provide a urine sample. The urine sample will be tested for bacteria and white blood cells. White blood cells are made by your body to help fight infection. TREATMENT  Typically, UTIs can be treated with medication. Because most UTIs are caused by a bacterial infection, they usually can be treated with the use of antibiotics. The choice of antibiotic and length of treatment depend on your symptoms and the type of bacteria causing your infection. HOME CARE INSTRUCTIONS  If you were prescribed antibiotics, take them exactly as your caregiver instructs you. Finish the medication even if you feel better after you have only taken some of the medication.  Drink enough water and fluids to keep your urine clear or pale yellow.  Avoid caffeine, tea, and carbonated beverages. They tend to irritate your bladder.  Empty your bladder often. Avoid holding urine for long periods of time.  Empty your  bladder before and after sexual intercourse.  After a bowel movement, women should cleanse from front to back. Use each tissue only once. SEEK MEDICAL CARE IF:   You have back pain.  You develop a fever.  Your symptoms do not begin to resolve within 3 days. SEEK IMMEDIATE MEDICAL CARE IF:   You have severe back pain or lower abdominal pain.  You develop chills.  You have nausea or vomiting.  You have continued burning or discomfort with urination. MAKE SURE YOU:   Understand these instructions.  Will watch your condition.  Will get help right away if you are not doing well or get worse. Document Released: 01/30/2005 Document Revised: 10/22/2011 Document Reviewed: 05/31/2011 [facility] Patient Information 2015 [place], [place]. This information is not intended to replace advice given to you by your health care provider. Make sure you discuss any questions you have with your health care provider.

## 2014-12-16 LAB — URINE CULTURE

## 2016-05-22 ENCOUNTER — Ambulatory Visit: Payer: Self-pay | Admitting: Physician Assistant

## 2016-06-10 ENCOUNTER — Ambulatory Visit: Payer: Self-pay | Admitting: Pediatrics

## 2016-06-27 ENCOUNTER — Encounter (INDEPENDENT_AMBULATORY_CARE_PROVIDER_SITE_OTHER): Payer: Self-pay

## 2016-06-27 ENCOUNTER — Encounter: Payer: Self-pay | Admitting: Pediatrics

## 2016-06-27 ENCOUNTER — Telehealth: Payer: Self-pay | Admitting: Pediatrics

## 2016-06-27 ENCOUNTER — Ambulatory Visit (INDEPENDENT_AMBULATORY_CARE_PROVIDER_SITE_OTHER): Payer: Medicare Other | Admitting: Pediatrics

## 2016-06-27 VITALS — BP 160/70 | HR 73 | Temp 98.8°F | Ht 66.5 in | Wt 238.4 lb

## 2016-06-27 DIAGNOSIS — Z1231 Encounter for screening mammogram for malignant neoplasm of breast: Secondary | ICD-10-CM | POA: Diagnosis not present

## 2016-06-27 DIAGNOSIS — E119 Type 2 diabetes mellitus without complications: Secondary | ICD-10-CM | POA: Diagnosis not present

## 2016-06-27 DIAGNOSIS — I10 Essential (primary) hypertension: Secondary | ICD-10-CM

## 2016-06-27 DIAGNOSIS — E785 Hyperlipidemia, unspecified: Secondary | ICD-10-CM

## 2016-06-27 DIAGNOSIS — G4700 Insomnia, unspecified: Secondary | ICD-10-CM | POA: Diagnosis not present

## 2016-06-27 DIAGNOSIS — Z1211 Encounter for screening for malignant neoplasm of colon: Secondary | ICD-10-CM

## 2016-06-27 DIAGNOSIS — Z1239 Encounter for other screening for malignant neoplasm of breast: Secondary | ICD-10-CM

## 2016-06-27 DIAGNOSIS — Z780 Asymptomatic menopausal state: Secondary | ICD-10-CM

## 2016-06-27 DIAGNOSIS — Z23 Encounter for immunization: Secondary | ICD-10-CM | POA: Diagnosis not present

## 2016-06-27 DIAGNOSIS — E039 Hypothyroidism, unspecified: Secondary | ICD-10-CM

## 2016-06-27 DIAGNOSIS — Z6837 Body mass index (BMI) 37.0-37.9, adult: Secondary | ICD-10-CM

## 2016-06-27 LAB — BAYER DCA HB A1C WAIVED: HB A1C: 7.8 % — AB (ref <7.0)

## 2016-06-27 MED ORDER — ZOLPIDEM TARTRATE 5 MG PO TABS: 5.0000 mg | ORAL_TABLET | Freq: Every evening | ORAL | 4 refills | Status: DC | PRN

## 2016-06-27 MED ORDER — METFORMIN HCL 1000 MG PO TABS: 500.0000 mg | ORAL_TABLET | Freq: Two times a day (BID) | ORAL | 0 refills | Status: DC

## 2016-06-27 NOTE — Telephone Encounter (Signed)
What is the name of the medication? Alyssa Mitchell 500mg  twice a day.   Have you contacted your pharmacy to request a refill? No, she was just seen today and forgot this one  [facility] [place]    Patient notified that their request is being sent to the clinical staff for review and that they should receive a call once it is complete. If they do not receive a call within 24 hours they can check with their pharmacy or our office.

## 2016-06-27 NOTE — Progress Notes (Addendum)
Subjective:   Patient ID: Alyssa Mitchell, female    DOB: [birth year], 66 y.o.   MRN: [id number] CC: New Patient (Initial Visit) follow up multiple med problems  HPI: Alyssa Mitchell is a 66 y.o. female presenting for New Patient (Initial Visit)  She was born deaf, has a hearing aid in L ear.  DM2: Takes metformin twice a day, morning and night Eats two chocolate kisses a day BGLs low 100s when she checks  HTN: Thinks her BP is lower at home but doesn't check  Insomnia: Was taking ambien nightly Stopped when she ran out 3 months ago When on [place] she was able to fall asleep on time, had a good schedule during the day Has had a hard time sleeping since then, sometimes just a couple hours she thinks Wants to restart  HLD: Taking lovastatin, no side effects  Father with colon ca at 19yo Has never had colonoscopy Normal stooling, no blood, no constipation  Pt with h/o breast ca in her 76s, s/p mastectomy Has been getting regular pap smears No history of abnormals  Past Medical History:  Diagnosis Date  . Diabetes mellitus without complication ([place])   . Hearing impaired person, bilateral    Hearing Aid left Ear  . Hypertension    Family History  Problem Relation Age of Onset  . Cancer Mother   . Cancer Father   . Cancer Maternal Grandmother    Social History   Social History  . Marital status: Married    Spouse name: N/A  . Number of children: N/A  . Years of education: N/A   Social History Main Topics  . Smoking status: Never Smoker  . Smokeless tobacco: Never Used  . Alcohol use No  . Drug use: No  . Sexual activity: Not Asked   Other Topics Concern  . None   Social History Narrative  . None   ROS: All systems negative other than what is in HPI  Objective:    BP (!) 160/70   Pulse 73   Temp 98.8 F (37.1 C) (Oral)   Ht 5' 6.5" (1.689 m)   Wt 238 lb 6.4 oz (108.1 kg)   BMI 37.90 kg/m   Wt Readings from Last 3 Encounters:  06/27/16 238 lb 6.4 oz  (108.1 kg)    Gen: NAD, alert, cooperative with exam, NCAT EYES: EOMI, no conjunctival injection, or no icterus ENT:  R TM pearly gray, L ear with hearing aid, OP without erythema LYMPH: no cervical LAD CV: NRRR, normal S1/S2, no murmur, distal pulses 2+ b/l Resp: CTABL, no wheezes, normal WOB Abd: +BS, soft, NTND. no guarding or organomegaly Ext: No edema, warm Neuro: Alert and oriented, strength equal b/l UE and LE, coordination grossly normal  Assessment & Plan:  Alyssa Mitchell was seen today for new patient (initial visit).  Diagnoses and all orders for this visit:  Type 2 diabetes mellitus without complication, without long-term current use of insulin (HCC) Check A1c Cont metformin Pt to call for eye exam Foot exam next visit -     CBC with Differential/Platelet -     TSH -     Bayer DCA Hb A1c Waived -     Microalbumin / creatinine urine ratio  Insomnia, unspecified type Ongoing problem Pt very much wants to restart Lorrin Mais, has worked before Discussed sleep hygiene, ways to minimize ambien use Take ambien no more than twice a week Gave Rx with refills Must be seen for refills Can try  melatonin other nights -     zolpidem (AMBIEN) 5 MG tablet; Take 1 tablet (5 mg total) by mouth at bedtime as needed for sleep.  Screen for colon cancer No symptoms, dad with colon ca at 30yo -     Ambulatory referral to Gastroenterology  Screening for breast cancer H/o breast ca in 19s, no return -     MM Digital Screening; Future  Essential hypertension Likely not controlled Elevated today Says always higher at clinic but doesn't check at home  Check at home, RTC 2 weeks for recheck nurse visit Will increase meds if still high -     CMP14+EGFR  Hyperlipidemia, unspecified hyperlipidemia type -     Lipid panel  BMI 37.0-37.9, adult Discussed lifestyle changes Uses exercise bike regularly Minimize sugar intake  Post-menopausal Pt says she used to be on medication for bone  density, last scan over 4 yrs ago -     DG [facility] DEXA  Follow up plan: Return in about 3 months (around 09/24/2016) for DM2. Assunta Found, MD [facility]  ADDENDUM: TSH elevated, will start levothyroxine 172mg daily, recheck 8 weeks

## 2016-06-27 NOTE — Patient Instructions (Signed)
Call to schedule eye exam  I ordered mammogram, referral to gastroenterology for colonoscopy and bone density scan. You should be contacted about those appointments. Let me know if you havent heard within a week.  Come back for nurse visit in 2 weeks for blood pressure recheck If still elevated we may need to change your medicines

## 2016-06-27 NOTE — Telephone Encounter (Signed)
Prescription was sent to [place], patient notified

## 2016-06-28 DIAGNOSIS — E1159 Type 2 diabetes mellitus with other circulatory complications: Secondary | ICD-10-CM | POA: Insufficient documentation

## 2016-06-28 DIAGNOSIS — E039 Hypothyroidism, unspecified: Secondary | ICD-10-CM | POA: Insufficient documentation

## 2016-06-28 DIAGNOSIS — I10 Essential (primary) hypertension: Secondary | ICD-10-CM

## 2016-06-28 DIAGNOSIS — Z780 Asymptomatic menopausal state: Secondary | ICD-10-CM | POA: Insufficient documentation

## 2016-06-28 DIAGNOSIS — E119 Type 2 diabetes mellitus without complications: Secondary | ICD-10-CM | POA: Insufficient documentation

## 2016-06-28 DIAGNOSIS — G4700 Insomnia, unspecified: Secondary | ICD-10-CM | POA: Insufficient documentation

## 2016-06-28 DIAGNOSIS — E1169 Type 2 diabetes mellitus with other specified complication: Secondary | ICD-10-CM | POA: Insufficient documentation

## 2016-06-28 DIAGNOSIS — E785 Hyperlipidemia, unspecified: Secondary | ICD-10-CM

## 2016-06-28 DIAGNOSIS — Z6837 Body mass index (BMI) 37.0-37.9, adult: Secondary | ICD-10-CM | POA: Insufficient documentation

## 2016-06-28 LAB — CMP14+EGFR
ALT: 22 IU/L (ref 0–32)
AST: 24 IU/L (ref 0–40)
Albumin/Globulin Ratio: 1.5 (ref 1.2–2.2)
Albumin: 4.4 g/dL (ref 3.6–4.8)
Alkaline Phosphatase: 76 IU/L (ref 39–117)
BUN / CREAT RATIO: 17 (ref 12–28)
BUN: 15 mg/dL (ref 8–27)
Bilirubin Total: 0.4 mg/dL (ref 0.0–1.2)
CHLORIDE: 98 mmol/L (ref 96–106)
CO2: 25 mmol/L (ref 18–29)
Calcium: 10 mg/dL (ref 8.7–10.3)
Creatinine, Ser: 0.88 mg/dL (ref 0.57–1.00)
GFR calc Af Amer: 80 (ref >59)
GFR calc non Af Amer: 69 (ref >59)
GLOBULIN, TOTAL: 3 (ref 1.5–4.5)
Glucose: 149 mg/dL — ABNORMAL HIGH (ref 65–99)
Potassium: 4.3 mmol/L (ref 3.5–5.2)
SODIUM: 141 mmol/L (ref 134–144)
Total Protein: 7.4 g/dL (ref 6.0–8.5)

## 2016-06-28 LAB — MICROALBUMIN / CREATININE URINE RATIO
CREATININE, UR: 73.5 mg/dL
MICROALBUM., U, RANDOM: 35 ug/mL
Microalb/Creat Ratio: 47.6 mg/g creat — ABNORMAL HIGH (ref 0.0–30.0)

## 2016-06-28 LAB — CBC WITH DIFFERENTIAL/PLATELET
BASOS ABS: 0 10*3/uL (ref 0.0–0.2)
Basos: 0 %
EOS (ABSOLUTE): 0.2 10*3/uL (ref 0.0–0.4)
EOS: 3 %
HEMATOCRIT: 42.3 % (ref 34.0–46.6)
Hemoglobin: 14.1 g/dL (ref 11.1–15.9)
Immature Grans (Abs): 0 10*3/uL (ref 0.0–0.1)
Immature Granulocytes: 0 %
LYMPHS ABS: 2.2 10*3/uL (ref 0.7–3.1)
Lymphs: 33 %
MCH: 30.7 pg (ref 26.6–33.0)
MCHC: 33.3 g/dL (ref 31.5–35.7)
MCV: 92 fL (ref 79–97)
Monocytes Absolute: 0.5 10*3/uL (ref 0.1–0.9)
Monocytes: 7 %
NEUTROS ABS: 3.9 10*3/uL (ref 1.4–7.0)
Neutrophils: 57 %
Platelets: 219 10*3/uL (ref 150–379)
RBC: 4.6 x10E6/uL (ref 3.77–5.28)
RDW: 13.5 % (ref 12.3–15.4)
WBC: 6.9 10*3/uL (ref 3.4–10.8)

## 2016-06-28 LAB — LIPID PANEL
Chol/HDL Ratio: 3.1 (ref 0.0–4.4)
Cholesterol, Total: 147 mg/dL (ref 100–199)
HDL: 47 mg/dL (ref >39)
LDL Calculated: 75 (ref 0–99)
Triglycerides: 125 mg/dL (ref 0–149)
VLDL Cholesterol Cal: 25 (ref 5–40)

## 2016-06-28 LAB — TSH: TSH: 9.5 u[IU]/mL — AB (ref 0.450–4.500)

## 2016-06-28 MED ORDER — LEVOTHYROXINE SODIUM 100 MCG PO TABS: 100.0000 ug | ORAL_TABLET | Freq: Every day | ORAL | 3 refills | Status: DC

## 2016-06-28 NOTE — Addendum Note (Signed)
Addended by: Eustaquio Maize on: 06/28/2016 07:50 AM   Modules accepted: Orders

## 2016-07-29 ENCOUNTER — Ambulatory Visit: Payer: Medicare Other | Admitting: Pediatrics

## 2016-07-29 NOTE — Progress Notes (Signed)
NA-cb 03/26

## 2016-07-30 ENCOUNTER — Encounter: Payer: Self-pay | Admitting: Pediatrics

## 2016-08-01 ENCOUNTER — Ambulatory Visit (INDEPENDENT_AMBULATORY_CARE_PROVIDER_SITE_OTHER): Payer: Medicare Other | Admitting: Pediatrics

## 2016-08-01 ENCOUNTER — Encounter: Payer: Self-pay | Admitting: Pediatrics

## 2016-08-01 VITALS — BP 169/78 | HR 63 | Temp 97.0°F | Ht 66.5 in | Wt 239.0 lb

## 2016-08-01 DIAGNOSIS — I10 Essential (primary) hypertension: Secondary | ICD-10-CM

## 2016-08-01 DIAGNOSIS — G4700 Insomnia, unspecified: Secondary | ICD-10-CM

## 2016-08-01 DIAGNOSIS — F419 Anxiety disorder, unspecified: Secondary | ICD-10-CM

## 2016-08-01 MED ORDER — TRIAZOLAM 0.125 MG PO TABS: 0.1250 mg | ORAL_TABLET | Freq: Every evening | ORAL | 2 refills | Status: DC | PRN

## 2016-08-01 MED ORDER — ATENOLOL-CHLORTHALIDONE 50-25 MG PO TABS: 2.0000 | ORAL_TABLET | Freq: Every day | ORAL | 3 refills | Status: DC

## 2016-08-01 MED ORDER — LOSARTAN POTASSIUM 50 MG PO TABS: 50.0000 mg | ORAL_TABLET | Freq: Every day | ORAL | 1 refills | Status: DC

## 2016-08-01 MED ORDER — CITALOPRAM HYDROBROMIDE 10 MG PO TABS: 10.0000 mg | ORAL_TABLET | Freq: Every day | ORAL | 3 refills | Status: DC

## 2016-08-01 NOTE — Patient Instructions (Signed)
Take citalopram once a day every morning for anxiety  Can take triazolam once at night for sleep if needed  Continue two tabs of atenolol/chlorthalidone every morning  Start losartan 50mg  every morning  Check blood pressures at home if you can

## 2016-08-01 NOTE — Progress Notes (Signed)
  Subjective:   Patient ID: Alyssa Mitchell, female    DOB: 09/11/1950, 66 y.o.   MRN: [id number] CC: medication check  HPI: Alyssa Mitchell is a 66 y.o. female presenting for medication check  HTN: Not checking BPs at home No HA, no CP  Insomnia: to bed 11-12a, wakes up 5-6am Has been on triazolam for years, has been off for the past 3 months Has had a much harder time time since stopping it Able to wake up at night if needed while on the triazolam  Continues to have intermittent anxiety, was on meprobamate in the past Says it would make her very calm and "zoned out":   Relevant past medical, surgical, family and social history reviewed. Allergies and medications reviewed and updated. History  Smoking Status  . Never Smoker  Smokeless Tobacco  . Never Used   ROS: Per HPI   Objective:    BP (!) 169/78   Pulse 63   Temp 97 F (36.1 C) (Oral)   Ht 5' 6.5" (1.689 m)   Wt 239 lb (108.4 kg)   BMI 38.00 kg/m   Wt Readings from Last 3 Encounters:  08/01/16 239 lb (108.4 kg)  06/27/16 238 lb 6.4 oz (108.1 kg)    Gen: NAD, alert, cooperative with exam, NCAT EYES: EOMI, no conjunctival injection, or no icterus ENT:  OP without erythema LYMPH: no cervical LAD CV: NRRR, normal S1/S2, no murmur, distal pulses 2+ b/l Resp: CTABL, no wheezes, normal WOB Abd: +BS, soft, NTND. Ext: No edema, warm Neuro: Alert and oriented, decreased hearing, reads lips  Assessment & Plan:  Taler was seen today for medication check.  Diagnoses and all orders for this visit:  Insomnia, unspecified type Has tried melatonin, did not help  Wants to restart below Will do low dose at night as needed for sleep -     triazolam (HALCION) 0.125 MG tablet; Take 1 tablet (0.125 mg total) by mouth at bedtime as needed for sleep.  Essential hypertension Not controlled Add losartan, cont other meds -     losartan (COZAAR) 50 MG tablet; Take 1 tablet (50 mg total) by mouth daily. -      atenolol-chlorthalidone (TENORETIC) 50-25 MG tablet; Take 2 tablets by mouth daily.  Anxiety Ongoing symptoms, apprx 3-4 times a week Start below, take daily -     citalopram (CELEXA) 10 MG tablet; Take 1 tablet (10 mg total) by mouth daily.   Follow up plan: Return in about 6 weeks (around 09/12/2016). Assunta Found, MD [place]

## 2016-08-08 ENCOUNTER — Other Ambulatory Visit: Payer: Self-pay | Admitting: Pediatrics

## 2016-08-08 DIAGNOSIS — M810 Age-related osteoporosis without current pathological fracture: Secondary | ICD-10-CM

## 2016-08-08 DIAGNOSIS — Z9012 Acquired absence of left breast and nipple: Secondary | ICD-10-CM

## 2016-08-08 DIAGNOSIS — Z780 Asymptomatic menopausal state: Secondary | ICD-10-CM

## 2016-08-08 DIAGNOSIS — Z853 Personal history of malignant neoplasm of breast: Secondary | ICD-10-CM

## 2016-08-22 ENCOUNTER — Ambulatory Visit (INDEPENDENT_AMBULATORY_CARE_PROVIDER_SITE_OTHER): Payer: Federal, State, Local not specified - PPO

## 2016-08-22 DIAGNOSIS — Z780 Asymptomatic menopausal state: Secondary | ICD-10-CM

## 2016-08-22 DIAGNOSIS — M810 Age-related osteoporosis without current pathological fracture: Secondary | ICD-10-CM | POA: Diagnosis not present

## 2016-08-23 ENCOUNTER — Telehealth: Payer: Self-pay | Admitting: Pediatrics

## 2016-08-23 NOTE — Telephone Encounter (Signed)
Aware., must be seen by provider for antibiotics.

## 2016-08-23 NOTE — Telephone Encounter (Signed)
What is the name of the medication? Clindamycin 150 MG  Have you contacted your pharmacy to request a refill? Yes, pharmacist told her to call us  [facility] [place]   Patient notified that their request is being sent to the clinical staff for review and that they should receive a call once it is complete. If they do not receive a call within 24 hours they can check with their pharmacy or our office.

## 2016-09-02 ENCOUNTER — Other Ambulatory Visit: Payer: Self-pay | Admitting: Pediatrics

## 2016-09-09 ENCOUNTER — Other Ambulatory Visit: Payer: Self-pay | Admitting: Family Medicine

## 2016-09-09 DIAGNOSIS — I10 Essential (primary) hypertension: Secondary | ICD-10-CM

## 2016-09-23 ENCOUNTER — Telehealth: Payer: Self-pay | Admitting: Pediatrics

## 2016-09-24 ENCOUNTER — Other Ambulatory Visit: Payer: Self-pay | Admitting: Pediatrics

## 2016-09-24 DIAGNOSIS — C50912 Malignant neoplasm of unspecified site of left female breast: Secondary | ICD-10-CM

## 2016-09-26 ENCOUNTER — Other Ambulatory Visit: Payer: Self-pay | Admitting: Family Medicine

## 2016-09-26 NOTE — Telephone Encounter (Signed)
Letter sent with appointment date/time 

## 2016-09-27 ENCOUNTER — Ambulatory Visit (INDEPENDENT_AMBULATORY_CARE_PROVIDER_SITE_OTHER): Payer: Federal, State, Local not specified - PPO | Admitting: Pediatrics

## 2016-09-27 ENCOUNTER — Encounter: Payer: Self-pay | Admitting: Pediatrics

## 2016-09-27 VITALS — BP 130/71 | HR 69 | Temp 98.0°F | Ht 66.5 in | Wt 242.6 lb

## 2016-09-27 DIAGNOSIS — K5790 Diverticulosis of intestine, part unspecified, without perforation or abscess without bleeding: Secondary | ICD-10-CM | POA: Insufficient documentation

## 2016-09-27 DIAGNOSIS — G4700 Insomnia, unspecified: Secondary | ICD-10-CM

## 2016-09-27 DIAGNOSIS — Z1231 Encounter for screening mammogram for malignant neoplasm of breast: Secondary | ICD-10-CM

## 2016-09-27 DIAGNOSIS — E119 Type 2 diabetes mellitus without complications: Secondary | ICD-10-CM

## 2016-09-27 DIAGNOSIS — F419 Anxiety disorder, unspecified: Secondary | ICD-10-CM | POA: Insufficient documentation

## 2016-09-27 DIAGNOSIS — K5792 Diverticulitis of intestine, part unspecified, without perforation or abscess without bleeding: Secondary | ICD-10-CM | POA: Diagnosis not present

## 2016-09-27 DIAGNOSIS — I10 Essential (primary) hypertension: Secondary | ICD-10-CM | POA: Diagnosis not present

## 2016-09-27 DIAGNOSIS — Z6837 Body mass index (BMI) 37.0-37.9, adult: Secondary | ICD-10-CM | POA: Diagnosis not present

## 2016-09-27 DIAGNOSIS — Z1239 Encounter for other screening for malignant neoplasm of breast: Secondary | ICD-10-CM

## 2016-09-27 DIAGNOSIS — E039 Hypothyroidism, unspecified: Secondary | ICD-10-CM

## 2016-09-27 DIAGNOSIS — Z1159 Encounter for screening for other viral diseases: Secondary | ICD-10-CM | POA: Diagnosis not present

## 2016-09-27 LAB — BAYER DCA HB A1C WAIVED: HB A1C (BAYER DCA - WAIVED): 7.6 % — ABNORMAL HIGH (ref <7.0)

## 2016-09-27 MED ORDER — TRIAZOLAM 0.125 MG PO TABS: 0.1250 mg | ORAL_TABLET | Freq: Every evening | ORAL | 2 refills | Status: DC | PRN

## 2016-09-27 MED ORDER — MEPROBAMATE 200 MG PO TABS: 200.0000 mg | ORAL_TABLET | Freq: Every day | ORAL | 1 refills | Status: DC | PRN

## 2016-09-27 MED ORDER — METRONIDAZOLE 500 MG PO TABS: 500.0000 mg | ORAL_TABLET | Freq: Three times a day (TID) | ORAL | 0 refills | Status: DC

## 2016-09-27 MED ORDER — CIPROFLOXACIN HCL 500 MG PO TABS: 500.0000 mg | ORAL_TABLET | Freq: Two times a day (BID) | ORAL | 0 refills | Status: DC

## 2016-09-27 NOTE — Progress Notes (Signed)
Subjective:   Patient ID: Alyssa Mitchell, female    DOB: [birth year], 66 y.o.   MRN: [id number] CC: Follow-up (2 month) multiple med problems HPI: [name] is a 66 y.o. female presenting for Follow-up (2 month)  HTN: No CP, no HA Not checking Bps at home  HLD: on statin, no s/e  Hypothyroidism: takes med daily, due for repeat TSH  DM2: Last A1c 7.8 Drinking diet coke and water  Anxiety: prescribed citalopram last viist Pt never started, says she doesn't want to try new medications Was on meprobamate as needed for years, not taking every day Wants to continue  Diverticulitis: Worsening LLQ abd, treated for diverticulitis in 2016, pain feels similar but not as severe Appetite is normal Stooling everyday  Insomnia: takes triazolam as needed, not every day  Relevant past medical, surgical, family and social history reviewed. Allergies and medications reviewed and updated. History  Smoking Status  . Never Smoker  Smokeless Tobacco  . Never Used   ROS: Per HPI   Objective:    BP 130/71   Pulse 69   Temp 98 F (36.7 C) (Oral)   Ht 5' 6.5" (1.689 m)   Wt 242 lb 9.6 oz (110 kg)   BMI 38.57 kg/m   Wt Readings from Last 3 Encounters:  09/27/16 242 lb 9.6 oz (110 kg)  08/01/16 239 lb (108.4 kg)  06/27/16 238 lb 6.4 oz (108.1 kg)    Gen: NAD, alert, cooperative with exam, NCAT EYES: EOMI, no conjunctival injection, or no icterus ENT: OP without erythema CV: NRRR, normal S1/S2 Resp: CTABL, no wheezes, normal WOB Abd: +BS, soft, TTP LLQ with palpation, ND. no guarding or organomegaly Ext: No edema, warm Neuro: Alert and oriented, strength equal b/l UE and LE, coordination grossly normal MSK: normal muscle bulk  Assessment & Plan:  [name] was seen today for follow-up and diverticulitis  Diagnoses and all orders for this visit:  Diverticulitis Pain consistent with last episode No fevers Will treat with below Any worsening symptoms needs to be seen -      ciprofloxacin (CIPRO) 500 MG tablet; Take 1 tablet (500 mg total) by mouth 2 (two) times daily. -     metroNIDAZOLE (FLAGYL) 500 MG tablet; Take 1 tablet (500 mg total) by mouth 3 (three) times daily.  Insomnia, unspecified type Take below as needed, not taking every night -     triazolam (HALCION) 0.125 MG tablet; Take 1 tablet (0.125 mg total) by mouth at bedtime as needed for sleep.  Anxiety Prescribed SSRI last visit, pt never filled, pt feels very strongly about continuing below as she has been on for some years and has worked when she needs it to Not taking every day If needing more frequently will try SSRI again -     meprobamate (EQUANIL) 200 MG tablet; Take 1 tablet (200 mg total) by mouth daily as needed for anxiety.  Type 2 diabetes mellitus without complication, without long-term current use of insulin (HCC) A1c 7.6, cont metformin Avoid sugary foods and snacks -     Bayer DCA Hb A1c Waived  Hypothyroidism, unspecified type Repeat TSH -     TSH  Essential hypertension Adequate control, cont current meds -     BMP8+EGFR  BMI 37.0-37.9, adult Increase physical activity, decrease snack foods  Screening for breast cancer -     MM Digital Screening; Future  Need for hepatitis C screening test -     Hepatitis C antibody  Follow up plan: Return in about 3 months (around 12/28/2016). Assunta Found, MD [place]

## 2016-09-28 LAB — BMP8+EGFR
BUN / CREAT RATIO: 16 (ref 12–28)
BUN: 14 mg/dL (ref 8–27)
CO2: 27 mmol/L (ref 18–29)
Calcium: 9.9 mg/dL (ref 8.7–10.3)
Chloride: 97 mmol/L (ref 96–106)
Creatinine, Ser: 0.88 mg/dL (ref 0.57–1.00)
GFR calc Af Amer: 80 mL/min/{1.73_m2} (ref >59)
GFR calc non Af Amer: 69 mL/min/{1.73_m2} (ref >59)
GLUCOSE: 175 mg/dL — AB (ref 65–99)
POTASSIUM: 4.4 mmol/L (ref 3.5–5.2)
SODIUM: 138 mmol/L (ref 134–144)

## 2016-09-28 LAB — HEPATITIS C ANTIBODY: Hep C Virus Ab: <0.1 s/co ratio (ref 0.0–0.9)

## 2016-09-28 LAB — TSH: TSH: 4.9 u[IU]/mL — ABNORMAL HIGH (ref 0.450–4.500)

## 2016-10-10 ENCOUNTER — Other Ambulatory Visit: Payer: Self-pay | Admitting: Pediatrics

## 2016-10-10 DIAGNOSIS — E039 Hypothyroidism, unspecified: Secondary | ICD-10-CM

## 2016-10-10 MED ORDER — LEVOTHYROXINE SODIUM 112 MCG PO TABS: 112.0000 ug | ORAL_TABLET | Freq: Every day | ORAL | 1 refills | Status: DC

## 2016-10-11 ENCOUNTER — Telehealth: Payer: Self-pay | Admitting: Pediatrics

## 2016-10-12 ENCOUNTER — Encounter: Payer: Self-pay | Admitting: *Deleted

## 2016-11-15 ENCOUNTER — Telehealth: Payer: Self-pay

## 2016-11-15 NOTE — Telephone Encounter (Signed)
Pt needs a mammogram scheduled and had inquired about having it done at our office. This is not possible because of her breast cancer history.  Where would she like this scheduled

## 2016-11-24 ENCOUNTER — Other Ambulatory Visit: Payer: Self-pay | Admitting: Pediatrics

## 2016-11-24 DIAGNOSIS — E039 Hypothyroidism, unspecified: Secondary | ICD-10-CM

## 2016-11-28 ENCOUNTER — Telehealth: Payer: Self-pay

## 2016-11-28 NOTE — Telephone Encounter (Signed)
INsurance will only cover 120 Halcion in 365 days   Needs prior auth if more than that   Is this a medication you are continuing long term and should I do prior auth?

## 2016-11-28 NOTE — Telephone Encounter (Signed)
Yes, will be long term medicine for now. Thanks debbi.

## 2016-11-29 ENCOUNTER — Telehealth: Payer: Self-pay | Admitting: Pediatrics

## 2016-11-29 NOTE — Telephone Encounter (Signed)
Received phone call from pharmacy stating that patient will need a Prior Authorization for Triazolam.  [phone]  ID- [id number]

## 2016-12-03 ENCOUNTER — Other Ambulatory Visit: Payer: Self-pay | Admitting: *Deleted

## 2016-12-03 DIAGNOSIS — E039 Hypothyroidism, unspecified: Secondary | ICD-10-CM

## 2016-12-03 MED ORDER — LEVOTHYROXINE SODIUM 112 MCG PO TABS: 112.0000 ug | ORAL_TABLET | Freq: Every day | ORAL | 1 refills | Status: DC

## 2016-12-06 ENCOUNTER — Encounter: Payer: Self-pay | Admitting: Pediatrics

## 2016-12-06 ENCOUNTER — Ambulatory Visit (INDEPENDENT_AMBULATORY_CARE_PROVIDER_SITE_OTHER): Payer: Federal, State, Local not specified - PPO | Admitting: Pediatrics

## 2016-12-06 ENCOUNTER — Ambulatory Visit (INDEPENDENT_AMBULATORY_CARE_PROVIDER_SITE_OTHER): Payer: Federal, State, Local not specified - PPO

## 2016-12-06 VITALS — BP 135/80 | HR 60 | Temp 98.1°F | Ht 66.5 in | Wt 243.0 lb

## 2016-12-06 DIAGNOSIS — I10 Essential (primary) hypertension: Secondary | ICD-10-CM | POA: Diagnosis not present

## 2016-12-06 DIAGNOSIS — M79672 Pain in left foot: Secondary | ICD-10-CM

## 2016-12-06 DIAGNOSIS — G4700 Insomnia, unspecified: Secondary | ICD-10-CM | POA: Diagnosis not present

## 2016-12-06 DIAGNOSIS — F419 Anxiety disorder, unspecified: Secondary | ICD-10-CM | POA: Diagnosis not present

## 2016-12-06 DIAGNOSIS — E119 Type 2 diabetes mellitus without complications: Secondary | ICD-10-CM

## 2016-12-06 MED ORDER — MEPROBAMATE 200 MG PO TABS: 200.0000 mg | ORAL_TABLET | Freq: Every day | ORAL | 1 refills | Status: DC | PRN

## 2016-12-06 MED ORDER — TRIAZOLAM 0.125 MG PO TABS: 0.1250 mg | ORAL_TABLET | Freq: Every evening | ORAL | 2 refills | Status: DC | PRN

## 2016-12-06 NOTE — Progress Notes (Signed)
  Subjective:   Patient ID: Alyssa Mitchell, female    DOB: 11/13/1950, 66 y.o.   MRN: [id number] CC: Foot Pain (Left)  HPI: Alyssa Mitchell is a 66 y.o. female presenting for Foot Pain (Left)  Started about a week ago Pain with weight bearing Woke up one morning and noticed the pain No injury Has had some bruising over top of foot prox to 5th, 4th toes Never injured foot before No fevers Otherwise feeling well  Anxiety: well controlled, rarely needing meprobamate  DM2: doesn't check BGLs at home, has been feeling well  HLD: taking statin daily  Insomnia: takes triazolam most nights, has been on for years  Relevant past medical, surgical, family and social history reviewed. Allergies and medications reviewed and updated. History  Smoking Status  . Never Smoker  Smokeless Tobacco  . Never Used   ROS: Per HPI   Objective:    BP 135/80   Pulse 60   Temp 98.1 F (36.7 C) (Oral)   Ht 5' 6.5" (1.689 m)   Wt 243 lb (110.2 kg)   BMI 38.63 kg/m   Wt Readings from Last 3 Encounters:  12/06/16 243 lb (110.2 kg)  09/27/16 242 lb 9.6 oz (110 kg)  08/01/16 239 lb (108.4 kg)    Gen: NAD, alert, cooperative with exam, NCAT EYES: EOMI, no conjunctival injection, or no icterus CV:  distal pulses 2+ b/l Resp: normal WOB Ext: No pitting edema, warm Neuro: Alert and oriented, strength equal b/l UE and LE, coordination grossly normal MSK: ttp along L foot 5th metatarsal, distal 4th metatarsal No point tenderness L ankle No pain with L ankle ROM or with eversion/inversion or dorsi/plantar flexion of L ankle against resistance Skin: purple bruising dorsum distal lateral L foot   Preliminary read by Assunta Found, MD:  No acute fracture. Will f/u final read  Assessment & Plan:  Alyssa Mitchell was seen today for foot pain and medical prob f/u.  Diagnoses and all orders for this visit:  Left foot pain NSAIDs as needed, avoid activities that make pain worse If not improving over next couple of  week rtc -     DG Foot Complete Left; Future  Insomnia, unspecified type Stable, cont below, Rx x 2 mo given -     triazolam (HALCION) 0.125 MG tablet; Take 1 tablet (0.125 mg total) by mouth at bedtime as needed for sleep.  Anxiety Stable, cont below, has not wanted to start any new medications. Rx x 2 mo given of below -     meprobamate (EQUANIL) 200 MG tablet; Take 1 tablet (200 mg total) by mouth daily as needed for anxiety.  Essential hypertension Stable, cont current medications  Type 2 diabetes mellitus without complication, without long-term current use of insulin ([place]) Not checking BGLs at home, last A1c 7.6 Will start AM BGLs Let me know if stay elevated Cont metformin  Follow up plan: 2 mo, labs due then Assunta Found, MD [place]

## 2016-12-07 ENCOUNTER — Telehealth: Payer: Self-pay | Admitting: Pediatrics

## 2016-12-07 NOTE — Telephone Encounter (Signed)
Can you please check on the prior authorization for this patient please

## 2016-12-12 ENCOUNTER — Other Ambulatory Visit: Payer: Self-pay | Admitting: Pediatrics

## 2016-12-13 NOTE — Telephone Encounter (Signed)
Last seen 12/06/16  DR Evette Doffing  IF approved [facility]

## 2016-12-30 ENCOUNTER — Other Ambulatory Visit: Payer: Self-pay | Admitting: *Deleted

## 2016-12-30 MED ORDER — METFORMIN HCL 1000 MG PO TABS: ORAL_TABLET | ORAL | 0 refills | Status: DC

## 2016-12-31 ENCOUNTER — Ambulatory Visit: Payer: Federal, State, Local not specified - PPO | Admitting: Pediatrics

## 2017-01-15 ENCOUNTER — Telehealth: Payer: Self-pay | Admitting: *Deleted

## 2017-01-15 NOTE — Telephone Encounter (Signed)
Left message for patient to call back to reschedule an appt with Dr. Evette Doffing on 10/11 due to a meeting

## 2017-01-23 ENCOUNTER — Other Ambulatory Visit: Payer: Self-pay | Admitting: Pediatrics

## 2017-01-23 DIAGNOSIS — I10 Essential (primary) hypertension: Secondary | ICD-10-CM

## 2017-01-31 ENCOUNTER — Other Ambulatory Visit: Payer: Self-pay | Admitting: Pediatrics

## 2017-01-31 DIAGNOSIS — I10 Essential (primary) hypertension: Secondary | ICD-10-CM

## 2017-02-13 ENCOUNTER — Ambulatory Visit: Payer: Federal, State, Local not specified - PPO | Admitting: Pediatrics

## 2017-02-17 ENCOUNTER — Telehealth: Payer: Self-pay | Admitting: Pediatrics

## 2017-02-17 ENCOUNTER — Encounter: Payer: Self-pay | Admitting: Pediatrics

## 2017-02-17 ENCOUNTER — Ambulatory Visit (INDEPENDENT_AMBULATORY_CARE_PROVIDER_SITE_OTHER): Payer: Federal, State, Local not specified - PPO | Admitting: Pediatrics

## 2017-02-17 VITALS — BP 152/85 | HR 57 | Temp 97.4°F | Ht 66.5 in | Wt 236.8 lb

## 2017-02-17 DIAGNOSIS — E785 Hyperlipidemia, unspecified: Secondary | ICD-10-CM | POA: Diagnosis not present

## 2017-02-17 DIAGNOSIS — E119 Type 2 diabetes mellitus without complications: Secondary | ICD-10-CM | POA: Diagnosis not present

## 2017-02-17 DIAGNOSIS — E039 Hypothyroidism, unspecified: Secondary | ICD-10-CM | POA: Diagnosis not present

## 2017-02-17 DIAGNOSIS — I10 Essential (primary) hypertension: Secondary | ICD-10-CM | POA: Diagnosis not present

## 2017-02-17 DIAGNOSIS — F419 Anxiety disorder, unspecified: Secondary | ICD-10-CM

## 2017-02-17 DIAGNOSIS — G4700 Insomnia, unspecified: Secondary | ICD-10-CM

## 2017-02-17 LAB — BAYER DCA HB A1C WAIVED: HB A1C (BAYER DCA - WAIVED): 7.6 % — ABNORMAL HIGH (ref <7.0)

## 2017-02-17 MED ORDER — MEPROBAMATE 200 MG PO TABS: 200.0000 mg | ORAL_TABLET | Freq: Every day | ORAL | 2 refills | Status: DC | PRN

## 2017-02-17 MED ORDER — LOSARTAN POTASSIUM 50 MG PO TABS: 50.0000 mg | ORAL_TABLET | Freq: Every day | ORAL | 0 refills | Status: DC

## 2017-02-17 MED ORDER — LEVOTHYROXINE SODIUM 112 MCG PO TABS: 112.0000 ug | ORAL_TABLET | Freq: Every day | ORAL | 1 refills | Status: DC

## 2017-02-17 MED ORDER — TRIAZOLAM 0.125 MG PO TABS: 0.1250 mg | ORAL_TABLET | Freq: Every evening | ORAL | 2 refills | Status: DC | PRN

## 2017-02-17 MED ORDER — METFORMIN HCL 1000 MG PO TABS: 1000.0000 mg | ORAL_TABLET | Freq: Two times a day (BID) | ORAL | 0 refills | Status: DC

## 2017-02-17 NOTE — Telephone Encounter (Signed)
She should take 1 tab (1000mg ) twice a day.

## 2017-02-17 NOTE — Progress Notes (Signed)
  Subjective:   Patient ID: Alyssa Mitchell, female    DOB: November 17, 1950, 66 y.o.   MRN: [id number] CC: Follow-up  HPI: Alyssa Mitchell is a 66 y.o. female presenting for Follow-up  HTN: doesn't check Bps at home No CP, SOB, HA or vision changes  DM2: eats a couple of hershey's kisses at night  Insomnia: taking triazolam most nights Has been on it for some years Helps with sleep Denies confusion, lightheadedness, falls  Anxiety: well controlled with the meprobamate Takes 4-5 times a week Not interested in alternate medication at this time  Relevant past medical, surgical, family and social history reviewed. Allergies and medications reviewed and updated. History  Smoking Status  . Never Smoker  Smokeless Tobacco  . Never Used   ROS: Per HPI   Objective:    BP (!) 152/85   Pulse (!) 57   Temp (!) 97.4 F (36.3 C) (Oral)   Ht 5' 6.5" (1.689 m)   Wt 236 lb 12.8 oz (107.4 kg)   BMI 37.65 kg/m   Wt Readings from Last 3 Encounters:  02/17/17 236 lb 12.8 oz (107.4 kg)  12/06/16 243 lb (110.2 kg)  09/27/16 242 lb 9.6 oz (110 kg)    Gen: NAD, alert, cooperative with exam, NCAT EYES: EOMI, no conjunctival injection, or no icterus ENT: OP without erythema, Hearing aid present L ear LYMPH: no cervical LAD CV: NRRR, normal S1/S2, no murmur, distal pulses 2+ b/l Resp: CTABL, no wheezes, normal WOB Abd: +BS, soft, NTND.  Ext: No edema, warm Neuro: Alert and oriented Psych: affect normal Mood has been fine  Assessment & Plan:  Alyssa Mitchell was seen today for follow-up.  Diagnoses and all orders for this visit:  Type 2 diabetes mellitus without complication, without long-term current use of insulin (HCC) A1c today 7.6 Decrease sugar intake Increase exercise Weight is down Cont metformin -     CMP14+EGFR -     Bayer DCA Hb A1c Waived -     metFORMIN (GLUCOPHAGE) 1000 MG tablet; Take 1 tablet (1,000 mg total) by mouth 2 (two) times daily with a meal. TAKE 1/2 TABLETS (500 MG TOTAL)  BY MOUTH 2 (TWO) TIMES DAILY.  Essential hypertension Remains elevated Increase losartan to '100mg'$  -     CMP14+EGFR -     losartan (COZAAR) 50 MG tablet; Take 1 tablet (50 mg total) by mouth daily.  Hypothyroidism, unspecified type Cont below -     CMP14+EGFR -     Thyroid Panel With TSH -     levothyroxine (SYNTHROID, LEVOTHROID) 112 MCG tablet; Take 1 tablet (112 mcg total) by mouth daily.  Hyperlipidemia, unspecified hyperlipidemia type Stable, cont statin  Anxiety Stable Has been very hesitant to try new medication such as SSRI to decrease use of below Cont below for now -     meprobamate (EQUANIL) 200 MG tablet; Take 1 tablet (200 mg total) by mouth daily as needed for anxiety.  Insomnia, unspecified type Taking at night only, not taking ambien -     triazolam (HALCION) 0.125 MG tablet; Take 1 tablet (0.125 mg total) by mouth at bedtime as needed for sleep.   Follow up plan: Return in about 3 months (around 05/20/2017). Assunta Found, MD [place]

## 2017-02-17 NOTE — Telephone Encounter (Signed)
Please review and advise.

## 2017-02-17 NOTE — Telephone Encounter (Signed)
Called [facility] with clarifacation

## 2017-02-18 LAB — CMP14+EGFR
A/G RATIO: 1.4 (ref 1.2–2.2)
ALT: 15 IU/L (ref 0–32)
AST: 21 IU/L (ref 0–40)
Albumin: 4.1 g/dL (ref 3.6–4.8)
Alkaline Phosphatase: 69 IU/L (ref 39–117)
BILIRUBIN TOTAL: 0.5 mg/dL (ref 0.0–1.2)
BUN/Creatinine Ratio: 16 (ref 12–28)
BUN: 14 mg/dL (ref 8–27)
CALCIUM: 10 mg/dL (ref 8.7–10.3)
CO2: 26 mmol/L (ref 20–29)
Chloride: 97 mmol/L (ref 96–106)
Creatinine, Ser: 0.85 mg/dL (ref 0.57–1.00)
GFR, EST AFRICAN AMERICAN: 83 mL/min/{1.73_m2} (ref >59)
GFR, EST NON AFRICAN AMERICAN: 72 mL/min/{1.73_m2} (ref >59)
GLOBULIN, TOTAL: 2.9 g/dL (ref 1.5–4.5)
Glucose: 148 mg/dL — ABNORMAL HIGH (ref 65–99)
POTASSIUM: 4.1 mmol/L (ref 3.5–5.2)
SODIUM: 140 mmol/L (ref 134–144)
Total Protein: 7 g/dL (ref 6.0–8.5)

## 2017-02-18 LAB — THYROID PANEL WITH TSH
Free Thyroxine Index: 2.8 (ref 1.2–4.9)
T3 Uptake Ratio: 31 % (ref 24–39)
T4, Total: 9 ug/dL (ref 4.5–12.0)
TSH: 7.73 u[IU]/mL — ABNORMAL HIGH (ref 0.450–4.500)

## 2017-02-24 MED ORDER — LEVOTHYROXINE SODIUM 125 MCG PO TABS: 125.0000 ug | ORAL_TABLET | Freq: Every day | ORAL | 1 refills | Status: DC

## 2017-02-24 NOTE — Addendum Note (Signed)
Addended by: Eustaquio Maize on: 02/24/2017 02:03 PM   Modules accepted: Orders

## 2017-03-04 ENCOUNTER — Encounter: Payer: Self-pay | Admitting: *Deleted

## 2017-03-07 ENCOUNTER — Other Ambulatory Visit: Payer: Self-pay | Admitting: Pediatrics

## 2017-03-07 NOTE — Telephone Encounter (Signed)
She is on the triazolam now for sleep per her request. She may not be on both the triazolam and the ambien at the same time.

## 2017-03-07 NOTE — Telephone Encounter (Signed)
Last seen 02/17/17  Dr Evette Doffing  If approved route to nurse to call into [facility]

## 2017-04-01 ENCOUNTER — Other Ambulatory Visit: Payer: Self-pay | Admitting: Pediatrics

## 2017-06-05 ENCOUNTER — Other Ambulatory Visit: Payer: Self-pay

## 2017-06-05 MED ORDER — LOVASTATIN 20 MG PO TABS: 20.0000 mg | ORAL_TABLET | Freq: Every day | ORAL | 0 refills | Status: DC

## 2017-06-06 ENCOUNTER — Telehealth: Payer: Self-pay | Admitting: Pediatrics

## 2017-06-09 ENCOUNTER — Encounter: Payer: Self-pay | Admitting: Pediatrics

## 2017-06-09 ENCOUNTER — Ambulatory Visit (INDEPENDENT_AMBULATORY_CARE_PROVIDER_SITE_OTHER): Payer: Federal, State, Local not specified - PPO | Admitting: Pediatrics

## 2017-06-09 VITALS — BP 140/80 | HR 60 | Temp 99.1°F | Ht 66.5 in | Wt 237.0 lb

## 2017-06-09 DIAGNOSIS — E119 Type 2 diabetes mellitus without complications: Secondary | ICD-10-CM | POA: Diagnosis not present

## 2017-06-09 DIAGNOSIS — E785 Hyperlipidemia, unspecified: Secondary | ICD-10-CM

## 2017-06-09 DIAGNOSIS — Z1211 Encounter for screening for malignant neoplasm of colon: Secondary | ICD-10-CM | POA: Diagnosis not present

## 2017-06-09 DIAGNOSIS — F419 Anxiety disorder, unspecified: Secondary | ICD-10-CM | POA: Diagnosis not present

## 2017-06-09 DIAGNOSIS — G4700 Insomnia, unspecified: Secondary | ICD-10-CM | POA: Diagnosis not present

## 2017-06-09 DIAGNOSIS — I10 Essential (primary) hypertension: Secondary | ICD-10-CM

## 2017-06-09 DIAGNOSIS — E039 Hypothyroidism, unspecified: Secondary | ICD-10-CM | POA: Diagnosis not present

## 2017-06-09 DIAGNOSIS — E1169 Type 2 diabetes mellitus with other specified complication: Secondary | ICD-10-CM | POA: Diagnosis not present

## 2017-06-09 LAB — BAYER DCA HB A1C WAIVED: HB A1C: 8.1 % — AB (ref <7.0)

## 2017-06-09 MED ORDER — METFORMIN HCL 1000 MG PO TABS: 1000.0000 mg | ORAL_TABLET | Freq: Two times a day (BID) | ORAL | 1 refills | Status: DC

## 2017-06-09 MED ORDER — ATENOLOL-CHLORTHALIDONE 50-25 MG PO TABS: ORAL_TABLET | ORAL | 1 refills | Status: DC

## 2017-06-09 MED ORDER — LOVASTATIN 20 MG PO TABS: 20.0000 mg | ORAL_TABLET | Freq: Every day | ORAL | 1 refills | Status: DC

## 2017-06-09 MED ORDER — SITAGLIPTIN PHOSPHATE 100 MG PO TABS: 100.0000 mg | ORAL_TABLET | Freq: Every day | ORAL | 3 refills | Status: DC

## 2017-06-09 MED ORDER — LOSARTAN POTASSIUM 100 MG PO TABS: 100.0000 mg | ORAL_TABLET | Freq: Every day | ORAL | 1 refills | Status: DC

## 2017-06-09 MED ORDER — MEPROBAMATE 200 MG PO TABS: 200.0000 mg | ORAL_TABLET | Freq: Every day | ORAL | 1 refills | Status: DC | PRN

## 2017-06-09 MED ORDER — TRIAZOLAM 0.125 MG PO TABS: 0.1250 mg | ORAL_TABLET | Freq: Every evening | ORAL | 3 refills | Status: DC | PRN

## 2017-06-09 NOTE — Progress Notes (Signed)
  Subjective:   Patient ID: Alyssa Mitchell, female    DOB: [birth year], 67 y.o.   MRN: [id number] CC: Follow-up (3 month) multiple med problems HPI: Alyssa Mitchell is a 67 y.o. female presenting for Follow-up (3 month)  Anxiety: symptoms controlled Rarely needing meprobamate  Insomnia: takes triazolam most nights  DM2: taking metformin regularly Eats 2 cookies every night, down from 5 Due for eye exam  HTN: doesn't check at home No CP, SOB Taking medicine regularly  Colon cancer screening: declined colonoscopy open to FIT test  Hypothyroidism: taking medicine regularly   Relevant past medical, surgical, family and social history reviewed. Allergies and medications reviewed and updated. Social History   Tobacco Use  Smoking Status Never Smoker  Smokeless Tobacco Never Used   ROS: Per HPI   Objective:    BP 140/80   Pulse 60   Temp 99.1 F (37.3 C) (Oral)   Ht 5' 6.5" (1.689 m)   Wt 237 lb (107.5 kg)   BMI 37.68 kg/m   Wt Readings from Last 3 Encounters:  06/09/17 237 lb (107.5 kg)  02/17/17 236 lb 12.8 oz (107.4 kg)  12/06/16 243 lb (110.2 kg)   Gen: NAD, alert, cooperative with exam, NCAT, reads lips due to decreased hearing EYES: EOMI, no conjunctival injection, or no icterus ENT: OP without erythema LYMPH: no cervical LAD CV: NRRR, normal S1/S2, no murmur, distal pulses 2+ b/l Resp: CTABL, no wheezes, normal WOB Abd: +BS, soft, NTND. no guarding or organomegaly Ext: No edema, warm Neuro: Alert and oriented MSK: normal muscle bulk  Assessment & Plan:  Alyssa Mitchell was seen today for follow-up med problems  Diagnoses and all orders for this visit:  Type 2 diabetes mellitus without complication, without long-term current use of insulin (HCC) A1c 8.1 Add januvia Decrease sugar intake -     Bayer DCA Hb A1c Waived -     metFORMIN (GLUCOPHAGE) 1000 MG tablet; Take 1 tablet (1,000 mg total) by mouth 2 (two) times daily with a meal. -     sitaGLIPtin (JANUVIA) 100 MG  tablet; Take 1 tablet (100 mg total) by mouth daily.  Essential hypertension Remains elevated Increase losartan to 100mg , sent in -     atenolol-chlorthalidone (TENORETIC) 50-25 MG tablet; TAKE 1 TABLET DAILY -     losartan (COZAAR) 100 MG tablet; Take 1 tablet (100 mg total) by mouth daily.  Anxiety Stable, cont below prn. Rarely needing -     meprobamate (EQUANIL) 200 MG tablet; Take 1 tablet (200 mg total) by mouth daily as needed for anxiety.  Insomnia, unspecified type Stable, takes below most ights -     triazolam (HALCION) 0.125 MG tablet; Take 1 tablet (0.125 mg total) by mouth at bedtime as needed for sleep.  Colon cancer screening Declined colonoscopy, agreed to below -     Fecal occult blood, imunochemical; Future  Hyperlipidemia associated with type 2 diabetes mellitus (HCC) Stable, cont below -     lovastatin (MEVACOR) 20 MG tablet; Take 1 tablet (20 mg total) by mouth at bedtime.  Hypothyroidism, unspecified type Cont med, check TSH after increase last visit -     TSH   Follow up plan: Return in about 3 months (around 09/06/2017). Assunta Found, MD [place]

## 2017-06-09 NOTE — Patient Instructions (Signed)
[place] Appointment: [phone]  Call to set up appt for eye exam

## 2017-06-10 LAB — TSH: TSH: 5.51 u[IU]/mL — ABNORMAL HIGH (ref 0.450–4.500)

## 2017-06-10 NOTE — Telephone Encounter (Signed)
Pt is calling to talk to to nurse about [place]. She has an interpreter available. Please call back

## 2017-06-10 NOTE — Telephone Encounter (Signed)
spoke with pt Pt wants Chanda to return call

## 2017-06-14 ENCOUNTER — Other Ambulatory Visit: Payer: Self-pay | Admitting: Pediatrics

## 2017-06-14 DIAGNOSIS — E039 Hypothyroidism, unspecified: Secondary | ICD-10-CM

## 2017-06-14 MED ORDER — LEVOTHYROXINE SODIUM 137 MCG PO TABS: 125.0000 ug | ORAL_TABLET | Freq: Every day | ORAL | 0 refills | Status: DC

## 2017-06-27 ENCOUNTER — Encounter: Payer: Self-pay | Admitting: *Deleted

## 2017-07-03 ENCOUNTER — Telehealth: Payer: Self-pay | Admitting: Pediatrics

## 2017-07-03 NOTE — Telephone Encounter (Signed)
LMTCB

## 2017-07-03 NOTE — Telephone Encounter (Signed)
Pt aware of results and to recheck in 3 mos

## 2017-09-02 ENCOUNTER — Other Ambulatory Visit: Payer: Self-pay | Admitting: Pediatrics

## 2017-09-02 DIAGNOSIS — I10 Essential (primary) hypertension: Secondary | ICD-10-CM

## 2017-09-03 ENCOUNTER — Other Ambulatory Visit: Payer: Self-pay | Admitting: Pediatrics

## 2017-09-03 DIAGNOSIS — E119 Type 2 diabetes mellitus without complications: Secondary | ICD-10-CM

## 2017-10-04 ENCOUNTER — Other Ambulatory Visit: Payer: Self-pay | Admitting: Pediatrics

## 2017-10-04 DIAGNOSIS — E039 Hypothyroidism, unspecified: Secondary | ICD-10-CM

## 2017-11-07 ENCOUNTER — Other Ambulatory Visit: Payer: Self-pay | Admitting: Pediatrics

## 2017-11-07 DIAGNOSIS — G4700 Insomnia, unspecified: Secondary | ICD-10-CM

## 2018-03-31 ENCOUNTER — Other Ambulatory Visit: Payer: Self-pay | Admitting: Pediatrics

## 2018-03-31 DIAGNOSIS — I10 Essential (primary) hypertension: Secondary | ICD-10-CM

## 2018-07-02 ENCOUNTER — Other Ambulatory Visit: Payer: Self-pay | Admitting: Pediatrics

## 2018-07-02 DIAGNOSIS — I10 Essential (primary) hypertension: Secondary | ICD-10-CM

## 2018-07-18 ENCOUNTER — Telehealth: Payer: Self-pay | Admitting: Family Medicine

## 2018-07-18 NOTE — Telephone Encounter (Signed)
Incoming call received from [place] on behalf of pt as pt is deaf. I am the on call provider for the weekend and the above called pertaining to the pts medications and care at our practice. Caller stated pt needed an appointment and was refused an appointment due to a bill. Caller also stated the pt needed her medications refilled and that it was illegal to withhold her medications. Caller made aware that the pt has been dismissed from this practice and that she has not been seen at our practice since 06/2017. Caller aware she has not had required blood work needed for prescribed medications. Caller was not satisfied with the answer. Caller aware that pt could go to the ED for blood work and possible refills on her medications until she could get in to see her PCP. I assured the caller I would check into the matter when I was back in the office and could speak to the practice administration. Caller then started to curse at this provider. I then ended the conversation and told the caller to have a great weekend.

## 2018-07-21 ENCOUNTER — Ambulatory Visit (INDEPENDENT_AMBULATORY_CARE_PROVIDER_SITE_OTHER): Payer: BLUE CROSS/BLUE SHIELD | Admitting: Family Medicine

## 2018-07-21 ENCOUNTER — Other Ambulatory Visit: Payer: Self-pay

## 2018-07-21 ENCOUNTER — Telehealth: Payer: Self-pay | Admitting: Family Medicine

## 2018-07-21 ENCOUNTER — Encounter: Payer: Self-pay | Admitting: Family Medicine

## 2018-07-21 VITALS — BP 182/84 | HR 67 | Temp 97.7°F | Ht 66.5 in | Wt 227.0 lb

## 2018-07-21 DIAGNOSIS — G4700 Insomnia, unspecified: Secondary | ICD-10-CM

## 2018-07-21 DIAGNOSIS — E119 Type 2 diabetes mellitus without complications: Secondary | ICD-10-CM

## 2018-07-21 DIAGNOSIS — E1169 Type 2 diabetes mellitus with other specified complication: Secondary | ICD-10-CM

## 2018-07-21 DIAGNOSIS — F419 Anxiety disorder, unspecified: Secondary | ICD-10-CM

## 2018-07-21 DIAGNOSIS — E1159 Type 2 diabetes mellitus with other circulatory complications: Secondary | ICD-10-CM

## 2018-07-21 DIAGNOSIS — I10 Essential (primary) hypertension: Secondary | ICD-10-CM

## 2018-07-21 DIAGNOSIS — E785 Hyperlipidemia, unspecified: Secondary | ICD-10-CM

## 2018-07-21 DIAGNOSIS — E039 Hypothyroidism, unspecified: Secondary | ICD-10-CM

## 2018-07-21 LAB — BAYER DCA HB A1C WAIVED: HB A1C (BAYER DCA - WAIVED): 13 % — ABNORMAL HIGH (ref <7.0)

## 2018-07-21 MED ORDER — LOSARTAN POTASSIUM 100 MG PO TABS: 100.0000 mg | ORAL_TABLET | Freq: Every day | ORAL | 1 refills | Status: DC

## 2018-07-21 MED ORDER — TRIAZOLAM 0.125 MG PO TABS: 0.1250 mg | ORAL_TABLET | Freq: Every evening | ORAL | 0 refills | Status: DC | PRN

## 2018-07-21 MED ORDER — SITAGLIPTIN PHOS-METFORMIN HCL 50-1000 MG PO TABS: 1.0000 | ORAL_TABLET | Freq: Two times a day (BID) | ORAL | 1 refills | Status: DC

## 2018-07-21 MED ORDER — SITAGLIPTIN PHOSPHATE 100 MG PO TABS: 100.0000 mg | ORAL_TABLET | Freq: Every day | ORAL | 1 refills | Status: DC

## 2018-07-21 MED ORDER — METFORMIN HCL 1000 MG PO TABS: 1000.0000 mg | ORAL_TABLET | Freq: Two times a day (BID) | ORAL | 1 refills | Status: DC

## 2018-07-21 MED ORDER — LEVOTHYROXINE SODIUM 137 MCG PO TABS: 125.0000 ug | ORAL_TABLET | Freq: Every day | ORAL | 0 refills | Status: DC

## 2018-07-21 MED ORDER — LOVASTATIN 20 MG PO TABS: 20.0000 mg | ORAL_TABLET | Freq: Every day | ORAL | 1 refills | Status: DC

## 2018-07-21 NOTE — Patient Instructions (Addendum)
Your sugar and blood pressure is UNCONTROLLED.  Your A1c increased for 8 to 13.  Typically, we start insulin at A1c of 11.  I think that the sugar is reflective of you spacing your medication out.  I have sent in refills.  I want you to take the Januvia and Metformin EVERY DAY as directed.  We will recheck your sugar in 3 months.  If it is still elevated, we need to talk about adding an injectable.  You had labs performed today.  You will be contacted with the results of the labs once they are available, usually in the next 3 business days for routine lab work.   I have given you a small supply of the sleep aid.  I recommend you use this very sparingly.  The other medication is not something I am comfortable prescribing.  We can certainly start something for anxiety if you are having anxiety more frequently.  If you are set on keeping the Meprobamate, I can refer to psychiatry for continuation of that one.  Let me know what you want to do.  Get your diabetic eye exam done and have them send me the results.  Get your mammogram scheduled.  Return in 3 months for recheck.

## 2018-07-21 NOTE — Telephone Encounter (Signed)
Januvia and Metformin.  janumet was cancelled.
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
## 2018-07-22 LAB — CMP14+EGFR
ALT: 34 IU/L — ABNORMAL HIGH (ref 0–32)
AST: 27 IU/L (ref 0–40)
Albumin/Globulin Ratio: 1.3 (ref 1.2–2.2)
Albumin: 4.1 g/dL (ref 3.8–4.8)
Alkaline Phosphatase: 105 IU/L (ref 39–117)
BUN/Creatinine Ratio: 23 (ref 12–28)
BUN: 15 mg/dL (ref 8–27)
Bilirubin Total: 0.4 mg/dL (ref 0.0–1.2)
CO2: 24 mmol/L (ref 20–29)
Calcium: 10 mg/dL (ref 8.7–10.3)
Chloride: 98 mmol/L (ref 96–106)
Creatinine, Ser: 0.64 mg/dL (ref 0.57–1.00)
GFR calc Af Amer: 107 mL/min/{1.73_m2} (ref >59)
GFR calc non Af Amer: 93 mL/min/{1.73_m2} (ref >59)
Globulin, Total: 3.1 g/dL (ref 1.5–4.5)
Glucose: 284 mg/dL — ABNORMAL HIGH (ref 65–99)
Potassium: 4.4 mmol/L (ref 3.5–5.2)
Sodium: 137 mmol/L (ref 134–144)
Total Protein: 7.2 g/dL (ref 6.0–8.5)

## 2018-07-22 LAB — LIPID PANEL
Chol/HDL Ratio: 3.6 ratio (ref 0.0–4.4)
Cholesterol, Total: 147 mg/dL (ref 100–199)
HDL: 41 mg/dL (ref >39)
LDL Calculated: 75 mg/dL (ref 0–99)
Triglycerides: 157 mg/dL — ABNORMAL HIGH (ref 0–149)
VLDL Cholesterol Cal: 31 mg/dL (ref 5–40)

## 2018-07-22 LAB — THYROID PANEL WITH TSH
Free Thyroxine Index: 1.4 (ref 1.2–4.9)
T3 Uptake Ratio: 21 % — ABNORMAL LOW (ref 24–39)
T4, Total: 6.7 ug/dL (ref 4.5–12.0)
TSH: 9.18 u[IU]/mL — AB (ref 0.450–4.500)

## 2018-07-22 NOTE — Telephone Encounter (Signed)
Patient aware.

## 2018-07-23 ENCOUNTER — Other Ambulatory Visit: Payer: Self-pay | Admitting: Family Medicine

## 2018-07-23 ENCOUNTER — Telehealth: Payer: Self-pay | Admitting: Family Medicine

## 2018-07-23 DIAGNOSIS — I10 Essential (primary) hypertension: Secondary | ICD-10-CM

## 2018-07-23 MED ORDER — ATENOLOL-CHLORTHALIDONE 50-25 MG PO TABS: ORAL_TABLET | ORAL | 1 refills | Status: DC

## 2018-07-23 NOTE — Telephone Encounter (Signed)
Pt needed refill on Tenoretic, done and pt aware

## 2018-09-14 ENCOUNTER — Ambulatory Visit (INDEPENDENT_AMBULATORY_CARE_PROVIDER_SITE_OTHER): Payer: Federal, State, Local not specified - PPO | Admitting: Family Medicine

## 2018-09-14 ENCOUNTER — Other Ambulatory Visit: Payer: Self-pay

## 2018-09-14 ENCOUNTER — Other Ambulatory Visit: Payer: Self-pay | Admitting: Family Medicine

## 2018-09-14 VITALS — BP 210/97 | HR 79 | Temp 97.6°F | Ht 66.5 in | Wt 226.0 lb

## 2018-09-14 DIAGNOSIS — G4700 Insomnia, unspecified: Secondary | ICD-10-CM

## 2018-09-14 DIAGNOSIS — K047 Periapical abscess without sinus: Secondary | ICD-10-CM

## 2018-09-14 DIAGNOSIS — I10 Essential (primary) hypertension: Secondary | ICD-10-CM | POA: Diagnosis not present

## 2018-09-14 MED ORDER — CLINDAMYCIN HCL 150 MG PO CAPS: 450.0000 mg | ORAL_CAPSULE | Freq: Three times a day (TID) | ORAL | 0 refills | Status: AC

## 2018-09-14 NOTE — Patient Instructions (Signed)
Make sure that you take your blood pressure medications.  I want you coming back in 1 week to have a blood pressure check with the nurse.  We may need to increase your blood pressure medications.  Your goal blood pressure is less than 140/90.  I have sent in an antibiotic for your tooth.  You may continue Tylenol.  I recommend rinsing your mouth out with salt water as well.  Please make sure that you schedule a visit with the dentist.   DASH Eating Plan DASH stands for "Dietary Approaches to Stop Hypertension." The DASH eating plan is a healthy eating plan that has been shown to reduce high blood pressure (hypertension). It may also reduce your risk for type 2 diabetes, heart disease, and stroke. The DASH eating plan may also help with weight loss. What are tips for following this plan?  General guidelines  Avoid eating more than 2,300 mg (milligrams) of salt (sodium) a day. If you have hypertension, you may need to reduce your sodium intake to 1,500 mg a day.  Limit alcohol intake to no more than 1 drink a day for nonpregnant women and 2 drinks a day for men. One drink equals 12 oz of beer, 5 oz of wine, or 1 oz of hard liquor.  Work with your health care provider to maintain a healthy body weight or to lose weight. Ask what an ideal weight is for you.  Get at least 30 minutes of exercise that causes your heart to beat faster (aerobic exercise) most days of the week. Activities may include walking, swimming, or biking.  Work with your health care provider or diet and nutrition specialist (dietitian) to adjust your eating plan to your individual calorie needs. Reading food labels   Check food labels for the amount of sodium per serving. Choose foods with less than 5 percent of the Daily Value of sodium. Generally, foods with less than 300 mg of sodium per serving fit into this eating plan.  To find whole grains, look for the word "whole" as the first word in the ingredient list. Shopping   Buy products labeled as "low-sodium" or "no salt added."  Buy fresh foods. Avoid canned foods and premade or frozen meals. Cooking  Avoid adding salt when cooking. Use salt-free seasonings or herbs instead of table salt or sea salt. Check with your health care provider or pharmacist before using salt substitutes.  Do not fry foods. Cook foods using healthy methods such as baking, boiling, grilling, and broiling instead.  Cook with heart-healthy oils, such as olive, canola, soybean, or sunflower oil. Meal planning  Eat a balanced diet that includes: ? 5 or more servings of fruits and vegetables each day. At each meal, try to fill half of your plate with fruits and vegetables. ? Up to 6-8 servings of whole grains each day. ? Less than 6 oz of lean meat, poultry, or fish each day. A 3-oz serving of meat is about the same size as a deck of cards. One egg equals 1 oz. ? 2 servings of low-fat dairy each day. ? A serving of nuts, seeds, or beans 5 times each week. ? Heart-healthy fats. Healthy fats called Omega-3 fatty acids are found in foods such as flaxseeds and coldwater fish, like sardines, salmon, and mackerel.  Limit how much you eat of the following: ? Canned or prepackaged foods. ? Food that is high in trans fat, such as fried foods. ? Food that is high in saturated fat,  such as fatty meat. ? Sweets, desserts, sugary drinks, and other foods with added sugar. ? Full-fat dairy products.  Do not salt foods before eating.  Try to eat at least 2 vegetarian meals each week.  Eat more home-cooked food and less restaurant, buffet, and fast food.  When eating at a restaurant, ask that your food be prepared with less salt or no salt, if possible. What foods are recommended? The items listed may not be a complete list. Talk with your dietitian about what dietary choices are best for you. Grains Whole-grain or whole-wheat bread. Whole-grain or whole-wheat pasta. Brown rice. [place]. Bulgur. Whole-grain and low-sodium cereals. Pita bread. Low-fat, low-sodium crackers. Whole-wheat flour tortillas. Vegetables Fresh or frozen vegetables (raw, steamed, roasted, or grilled). Low-sodium or reduced-sodium tomato and vegetable juice. Low-sodium or reduced-sodium tomato sauce and tomato paste. Low-sodium or reduced-sodium canned vegetables. Fruits All fresh, dried, or frozen fruit. Canned fruit in natural juice (without added sugar). Meat and other protein foods Skinless chicken or [place]. Ground chicken or [place]. Pork with fat trimmed off. Fish and seafood. Egg whites. Dried beans, peas, or lentils. Unsalted nuts, nut butters, and seeds. Unsalted canned beans. Lean cuts of beef with fat trimmed off. Low-sodium, lean deli meat. Dairy Low-fat (1%) or fat-free (skim) milk. Fat-free, low-fat, or reduced-fat cheeses. Nonfat, low-sodium ricotta or cottage cheese. Low-fat or nonfat yogurt. Low-fat, low-sodium cheese. Fats and oils Soft margarine without trans fats. Vegetable oil. Low-fat, reduced-fat, or light mayonnaise and salad dressings (reduced-sodium). Canola, safflower, olive, soybean, and sunflower oils. Avocado. Seasoning and other foods Herbs. Spices. Seasoning mixes without salt. Unsalted popcorn and pretzels. Fat-free sweets. What foods are not recommended? The items listed may not be a complete list. Talk with your dietitian about what dietary choices are best for you. Grains Baked goods made with fat, such as croissants, muffins, or some breads. Dry pasta or rice meal packs. Vegetables Creamed or fried vegetables. Vegetables in a cheese sauce. Regular canned vegetables (not low-sodium or reduced-sodium). Regular canned tomato sauce and paste (not low-sodium or reduced-sodium). Regular tomato and vegetable juice (not low-sodium or reduced-sodium). Angie Fava. Olives. Fruits Canned fruit in a light or heavy syrup. Fried fruit. Fruit in cream or butter sauce. Meat and  other protein foods Fatty cuts of meat. Ribs. Fried meat. Berniece Salines. Sausage. Bologna and other processed lunch meats. Salami. Fatback. Hotdogs. Bratwurst. Salted nuts and seeds. Canned beans with added salt. Canned or smoked fish. Whole eggs or egg yolks. Chicken or [place] with skin. Dairy Whole or 2% milk, cream, and half-and-half. Whole or full-fat cream cheese. Whole-fat or sweetened yogurt. Full-fat cheese. Nondairy creamers. Whipped toppings. Processed cheese and cheese spreads. Fats and oils Butter. Stick margarine. Lard. Shortening. Ghee. Bacon fat. Tropical oils, such as coconut, palm kernel, or palm oil. Seasoning and other foods Salted popcorn and pretzels. Onion salt, garlic salt, seasoned salt, table salt, and sea salt. Worcestershire sauce. Tartar sauce. Barbecue sauce. Teriyaki sauce. Soy sauce, including reduced-sodium. Steak sauce. Canned and packaged gravies. Fish sauce. Oyster sauce. Cocktail sauce. Horseradish that you find on the shelf. Ketchup. Mustard. Meat flavorings and tenderizers. Bouillon cubes. Hot sauce and Tabasco sauce. Premade or packaged marinades. Premade or packaged taco seasonings. Relishes. Regular salad dressings. Where to find more information:  National Heart, Lung, and [place]: https://wilson-eaton.com/  American Heart Association: www.heart.org Summary  The DASH eating plan is a healthy eating plan that has been shown to reduce high blood pressure (hypertension). It may also reduce your  risk for type 2 diabetes, heart disease, and stroke.  With the DASH eating plan, you should limit salt (sodium) intake to 2,300 mg a day. If you have hypertension, you may need to reduce your sodium intake to 1,500 mg a day.  When on the DASH eating plan, aim to eat more fresh fruits and vegetables, whole grains, lean proteins, low-fat dairy, and heart-healthy fats.  Work with your health care provider or diet and nutrition specialist (dietitian) to adjust your eating plan  to your individual calorie needs. This information is not intended to replace advice given to you by your health care provider. Make sure you discuss any questions you have with your health care provider. Document Released: 04/11/2011 Document Revised: 04/15/2016 Document Reviewed: 04/15/2016 Elsevier Interactive Patient Education  2019 [employer].

## 2018-09-14 NOTE — Progress Notes (Signed)
Subjective: CC: infected tooth PCP: [name], DO QAS:TMHD Alyssa Mitchell is a 68 y.o. female presenting to clinic today for:  1. Infected tooth Patient is accompanied to the office visit by her boyfriend today.  She reports that about 3 to 4 weeks ago she started having pain and swelling in the right upper gumline.  She over the last week or so has developed similar pain and swelling along the right lower gumline.  She has several broken teeth.  Denies any fevers, foul taste in the mouth.  She has been taking Tylenol for pain and using Orajel but no other therapies tried.  She has not seen a dentist.  2.  Hypertension Patient reports that she did not take her blood pressure medications today because she forgot.  She is otherwise been compliant with them.  She does not monitor blood pressures at home.  She denies any chest pain, shortness of breath, headache.  She does report eating potato chips and pizza and other salty foods at home but "never add salt to food".  ROS: Per HPI  Allergies  Allergen Reactions  . Trimox [Amoxicillin] Rash   Past Medical History:  Diagnosis Date  . Diabetes mellitus without complication ([place])   . Hearing impaired person, bilateral    Hearing Aid left Ear  . Hypertension     Current Outpatient Medications:  .  atenolol-chlorthalidone (TENORETIC) 50-25 MG tablet, TAKE 1 TABLET DAILY, Disp: 90 tablet, Rfl: 1 .  levothyroxine (SYNTHROID, LEVOTHROID) 137 MCG tablet, Take 1 tablet (137 mcg total) by mouth daily., Disp: 90 tablet, Rfl: 0 .  losartan (COZAAR) 100 MG tablet, Take 1 tablet (100 mg total) by mouth daily., Disp: 90 tablet, Rfl: 1 .  lovastatin (MEVACOR) 20 MG tablet, Take 1 tablet (20 mg total) by mouth at bedtime., Disp: 90 tablet, Rfl: 1 .  metFORMIN (GLUCOPHAGE) 1000 MG tablet, Take 1 tablet (1,000 mg total) by mouth 2 (two) times daily with a meal., Disp: 90 tablet, Rfl: 1 .  sitaGLIPtin (JANUVIA) 100 MG tablet, Take 1 tablet (100 mg total)  by mouth daily., Disp: 90 tablet, Rfl: 1 .  triazolam (HALCION) 0.125 MG tablet, Take 1 tablet (0.125 mg total) by mouth at bedtime as needed for sleep., Disp: 30 tablet, Rfl: 0 Social History   Socioeconomic History  . Marital status: Married    Spouse name: Not on file  . Number of children: Not on file  . Years of education: Not on file  . Highest education level: Not on file  Occupational History  . Not on file  Social Needs  . Financial resource strain: Not on file  . Food insecurity:    Worry: Not on file    Inability: Not on file  . Transportation needs:    Medical: Not on file    Non-medical: Not on file  Tobacco Use  . Smoking status: Never Smoker  . Smokeless tobacco: Never Used  Substance and Sexual Activity  . Alcohol use: No  . Drug use: No  . Sexual activity: Not on file  Lifestyle  . Physical activity:    Days per week: Not on file    Minutes per session: Not on file  . Stress: Not on file  Relationships  . Social connections:    Talks on phone: Not on file    Gets together: Not on file    Attends religious service: Not on file    Active member of club or organization: Not  on file    Attends meetings of clubs or organizations: Not on file    Relationship status: Not on file  . Intimate partner violence:    Fear of current or ex partner: Not on file    Emotionally abused: Not on file    Physically abused: Not on file    Forced sexual activity: Not on file  Other Topics Concern  . Not on file  Social History Narrative  . Not on file   Family History  Problem Relation Age of Onset  . Cancer Mother   . Cancer Father   . Cancer Maternal Grandmother     Objective: Office vital signs reviewed. BP (!) 210/97   Pulse 79   Temp 97.6 F (36.4 C) (Oral)   Ht 5' 6.5" (1.689 m)   Wt 226 lb (102.5 kg)   BMI 35.93 kg/m   Physical Examination:  General: Awake, alert, well nourished, nontoxic. No acute distress HEENT:  Mouth: Patient with several  broken teeth and dental caries noted.  She has mild soft tissue swelling noted along the gumline on the right inferior aspect of the gums.  No appreciable purulence or fluctuance noted.  Area is tender to palpation. Neuro: deaf  Assessment/ Plan: 68 y.o. female   1. Dental infection She has a penicillin allergy.  Cleocin 450 mg 3 times daily prescribed for 7 days.  Recommended salt water rinses.  I have asked that she go ahead and schedule an appoint with the dentist as she will likely need extraction of her teeth.  We discussed reasons for emergent evaluation.  She voiced good understanding - clindamycin (CLEOCIN) 150 MG capsule; Take 3 capsules (450 mg total) by mouth 3 (three) times daily for 7 days.  Dispense: 63 capsule; Refill: 0  2. Accelerated hypertension I have asked that she return to the office in 1 week for blood pressure check with the nurse.  She will likely need escalation of her antihypertensives as she was hypertensive at last visit but again had had a lapse in medication.  We will plan to increase the Tenoretic if she has persistent elevations in blood pressure.  We discussed that consuming things like pizza and potato chips were not acceptable, particularly given high blood pressures.  We had a frank discussion about eating fresh fruits, vegetables and meats.   No orders of the defined types were placed in this encounter.  No orders of the defined types were placed in this encounter.    [name], DO [place] [phone]

## 2018-09-18 ENCOUNTER — Other Ambulatory Visit: Payer: Self-pay

## 2018-09-21 ENCOUNTER — Ambulatory Visit (INDEPENDENT_AMBULATORY_CARE_PROVIDER_SITE_OTHER): Payer: Federal, State, Local not specified - PPO | Admitting: Family Medicine

## 2018-09-21 ENCOUNTER — Encounter: Payer: Self-pay | Admitting: Family Medicine

## 2018-09-21 ENCOUNTER — Other Ambulatory Visit: Payer: Self-pay

## 2018-09-21 VITALS — BP 130/69 | HR 59 | Temp 97.1°F | Ht 66.5 in | Wt 223.0 lb

## 2018-09-21 DIAGNOSIS — R7989 Other specified abnormal findings of blood chemistry: Secondary | ICD-10-CM

## 2018-09-21 DIAGNOSIS — E039 Hypothyroidism, unspecified: Secondary | ICD-10-CM

## 2018-09-21 DIAGNOSIS — E1159 Type 2 diabetes mellitus with other circulatory complications: Secondary | ICD-10-CM | POA: Diagnosis not present

## 2018-09-21 DIAGNOSIS — I152 Hypertension secondary to endocrine disorders: Secondary | ICD-10-CM

## 2018-09-21 DIAGNOSIS — R945 Abnormal results of liver function studies: Secondary | ICD-10-CM

## 2018-09-21 DIAGNOSIS — I10 Essential (primary) hypertension: Secondary | ICD-10-CM

## 2018-09-21 NOTE — Progress Notes (Signed)
Subjective: CC: HTN follow up PCP: [name], DO STM:HDQQ Alyssa Mitchell is a 68 y.o. female presenting to clinic today for:  1. Hypertension Patient reports compliance with her Tenoretic 50-25 mg daily and losartan 100 mg daily.  No chest pain, shortness of breath, headache, lower extremity edema.  She notes that her dental pain has improved quite a bit with the antibiotic.  2. Hypothyroidism Reports compliance with levothyroxine 137 mcg daily.  No diarrhea, constipation, unplanned weight changes, heart palpitations or change in energy.  She reports she feels good.   ROS: Per HPI  Allergies  Allergen Reactions  . Trimox [Amoxicillin] Rash   Past Medical History:  Diagnosis Date  . Diabetes mellitus without complication ([place])   . Hearing impaired person, bilateral    Hearing Aid left Ear  . Hypertension     Current Outpatient Medications:  .  atenolol-chlorthalidone (TENORETIC) 50-25 MG tablet, TAKE 1 TABLET DAILY, Disp: 90 tablet, Rfl: 1 .  clindamycin (CLEOCIN) 150 MG capsule, Take 3 capsules (450 mg total) by mouth 3 (three) times daily for 7 days., Disp: 63 capsule, Rfl: 0 .  levothyroxine (SYNTHROID, LEVOTHROID) 137 MCG tablet, Take 1 tablet (137 mcg total) by mouth daily., Disp: 90 tablet, Rfl: 0 .  losartan (COZAAR) 100 MG tablet, Take 1 tablet (100 mg total) by mouth daily., Disp: 90 tablet, Rfl: 1 .  lovastatin (MEVACOR) 20 MG tablet, Take 1 tablet (20 mg total) by mouth at bedtime., Disp: 90 tablet, Rfl: 1 .  metFORMIN (GLUCOPHAGE) 1000 MG tablet, Take 1 tablet (1,000 mg total) by mouth 2 (two) times daily with a meal., Disp: 90 tablet, Rfl: 1 .  sitaGLIPtin (JANUVIA) 100 MG tablet, Take 1 tablet (100 mg total) by mouth daily., Disp: 90 tablet, Rfl: 1 .  triazolam (HALCION) 0.125 MG tablet, TAKE 1 TABLET (0.125 MG TOTAL) BY MOUTH AT BEDTIME AS NEEDED FOR SLEEP., Disp: 30 tablet, Rfl: 0 Social History   Socioeconomic History  . Marital status: Married    Spouse  name: Not on file  . Number of children: Not on file  . Years of education: Not on file  . Highest education level: Not on file  Occupational History  . Not on file  Social Needs  . Financial resource strain: Not on file  . Food insecurity:    Worry: Not on file    Inability: Not on file  . Transportation needs:    Medical: Not on file    Non-medical: Not on file  Tobacco Use  . Smoking status: Never Smoker  . Smokeless tobacco: Never Used  Substance and Sexual Activity  . Alcohol use: No  . Drug use: No  . Sexual activity: Not on file  Lifestyle  . Physical activity:    Days per week: Not on file    Minutes per session: Not on file  . Stress: Not on file  Relationships  . Social connections:    Talks on phone: Not on file    Gets together: Not on file    Attends religious service: Not on file    Active member of club or organization: Not on file    Attends meetings of clubs or organizations: Not on file    Relationship status: Not on file  . Intimate partner violence:    Fear of current or ex partner: Not on file    Emotionally abused: Not on file    Physically abused: Not on file    Forced  sexual activity: Not on file  Other Topics Concern  . Not on file  Social History Narrative  . Not on file   Family History  Problem Relation Age of Onset  . Cancer Mother   . Cancer Father   . Cancer Maternal Grandmother     Objective: Office vital signs reviewed. BP 130/69   Pulse (!) 59   Temp (!) 97.1 F (36.2 C) (Oral)   Ht 5' 6.5" (1.689 m)   Wt 223 lb (101.2 kg)   BMI 35.45 kg/m   Physical Examination:  General: Awake, alert, well nourished, No acute distress HEENT: Normal, sclera white, MMM Cardio: regular rate and rhythm, S1S2 heard, no murmurs appreciated Pulm: clear to auscultation bilaterally, no wheezes, rhonchi or rales; normal work of breathing on room air Extremities: warm, well perfused, No edema, cyanosis or clubbing; +2 pulses bilaterally  Skin: Dry, intact, normal temperature Neuro: No tremor  Assessment/ Plan: 68 y.o. female   1. Hypertension associated with diabetes ([place]) Under much better control today.  Continue current regimen.  Check CMP given elevated LFTs a few months ago - CMP14+EGFR  2. Hypothyroidism, unspecified type Was not within normal range at last visit.  Check thyroid panel. - Thyroid Panel With TSH  3. Elevated liver function tests Possibly related to uncontrolled hypothyroidism at last visit.  Recheck liver function tests - CMP14+EGFR   No orders of the defined types were placed in this encounter.  No orders of the defined types were placed in this encounter.    [name], DO [place] [phone]

## 2018-09-21 NOTE — Patient Instructions (Addendum)
You had labs performed today.  You will be contacted with the results of the labs once they are available, usually in the next 3 business days for routine lab work.   I will release your labs to MyChart

## 2018-09-22 LAB — THYROID PANEL WITH TSH
Free Thyroxine Index: 1.7 (ref 1.2–4.9)
T3 Uptake Ratio: 22 % — ABNORMAL LOW (ref 24–39)
T4, Total: 7.5 ug/dL (ref 4.5–12.0)
TSH: 7.06 u[IU]/mL — ABNORMAL HIGH (ref 0.450–4.500)

## 2018-09-22 LAB — CMP14+EGFR
ALT: 18 IU/L (ref 0–32)
AST: 22 IU/L (ref 0–40)
Albumin/Globulin Ratio: 1.4 (ref 1.2–2.2)
Albumin: 4.1 g/dL (ref 3.8–4.8)
Alkaline Phosphatase: 76 IU/L (ref 39–117)
BUN/Creatinine Ratio: 25 (ref 12–28)
BUN: 22 mg/dL (ref 8–27)
Bilirubin Total: 0.4 mg/dL (ref 0.0–1.2)
CO2: 24 mmol/L (ref 20–29)
Calcium: 10 mg/dL (ref 8.7–10.3)
Chloride: 96 mmol/L (ref 96–106)
Creatinine, Ser: 0.87 mg/dL (ref 0.57–1.00)
GFR calc Af Amer: 80 mL/min/{1.73_m2} (ref >59)
GFR calc non Af Amer: 69 mL/min/{1.73_m2} (ref >59)
Globulin, Total: 3 g/dL (ref 1.5–4.5)
Glucose: 212 mg/dL — ABNORMAL HIGH (ref 65–99)
Potassium: 4.3 mmol/L (ref 3.5–5.2)
Sodium: 134 mmol/L (ref 134–144)
Total Protein: 7.1 g/dL (ref 6.0–8.5)

## 2018-09-23 ENCOUNTER — Telehealth: Payer: Self-pay | Admitting: Family Medicine

## 2018-09-23 NOTE — Telephone Encounter (Signed)
Patient aware of results.

## 2018-09-23 NOTE — Telephone Encounter (Signed)
Pt is calling back to get lab results

## 2018-10-12 ENCOUNTER — Telehealth (INDEPENDENT_AMBULATORY_CARE_PROVIDER_SITE_OTHER): Payer: Self-pay | Admitting: Family Medicine

## 2018-10-12 DIAGNOSIS — Z9010 Acquired absence of unspecified breast and nipple: Secondary | ICD-10-CM

## 2018-10-12 NOTE — Telephone Encounter (Signed)
Ok to approve.  I see orders are pending, Please send orders to me for to [facility]

## 2018-10-12 NOTE — Telephone Encounter (Signed)
Pt has called in wanting to speak to nurse about something personal, didn't want to give anymore information.

## 2018-10-12 NOTE — Telephone Encounter (Signed)
Patient states that her mastectomy form is leaking and will need a new.  Order to be faxed to [phone]

## 2018-10-12 NOTE — Addendum Note (Signed)
Addended by: Wardell Heath on: 10/12/2018 11:44 AM   Modules accepted: Orders

## 2018-10-13 NOTE — Addendum Note (Signed)
Addended by: Wardell Heath on: 10/13/2018 08:25 AM   Modules accepted: Orders

## 2018-10-20 ENCOUNTER — Telehealth: Payer: Self-pay | Admitting: Family Medicine

## 2018-10-21 ENCOUNTER — Ambulatory Visit: Payer: Medicare Other | Admitting: Family Medicine

## 2018-10-23 NOTE — Progress Notes (Signed)
No show

## 2018-11-07 ENCOUNTER — Other Ambulatory Visit: Payer: Self-pay | Admitting: Family Medicine

## 2018-11-07 DIAGNOSIS — G4700 Insomnia, unspecified: Secondary | ICD-10-CM

## 2018-11-09 ENCOUNTER — Other Ambulatory Visit: Payer: Self-pay | Admitting: Family Medicine

## 2018-11-09 DIAGNOSIS — G4700 Insomnia, unspecified: Secondary | ICD-10-CM

## 2018-11-10 ENCOUNTER — Other Ambulatory Visit: Payer: Self-pay | Admitting: Family Medicine

## 2018-11-10 DIAGNOSIS — G4700 Insomnia, unspecified: Secondary | ICD-10-CM

## 2018-11-10 NOTE — Telephone Encounter (Signed)
Denied.  This is controlled.  Patient no showed to last appt.

## 2018-11-11 ENCOUNTER — Ambulatory Visit: Payer: Medicare Other | Admitting: Family Medicine

## 2018-11-11 ENCOUNTER — Other Ambulatory Visit: Payer: Self-pay

## 2018-11-13 ENCOUNTER — Other Ambulatory Visit: Payer: Self-pay

## 2018-11-13 ENCOUNTER — Encounter: Payer: Self-pay | Admitting: Family Medicine

## 2018-11-13 ENCOUNTER — Ambulatory Visit (INDEPENDENT_AMBULATORY_CARE_PROVIDER_SITE_OTHER): Payer: Federal, State, Local not specified - PPO | Admitting: Family Medicine

## 2018-11-13 VITALS — BP 162/80 | HR 68 | Temp 98.6°F | Ht 66.5 in | Wt 228.0 lb

## 2018-11-13 DIAGNOSIS — E1159 Type 2 diabetes mellitus with other circulatory complications: Secondary | ICD-10-CM

## 2018-11-13 DIAGNOSIS — E119 Type 2 diabetes mellitus without complications: Secondary | ICD-10-CM

## 2018-11-13 DIAGNOSIS — E039 Hypothyroidism, unspecified: Secondary | ICD-10-CM

## 2018-11-13 DIAGNOSIS — Z79899 Other long term (current) drug therapy: Secondary | ICD-10-CM

## 2018-11-13 DIAGNOSIS — I10 Essential (primary) hypertension: Secondary | ICD-10-CM | POA: Diagnosis not present

## 2018-11-13 DIAGNOSIS — I152 Hypertension secondary to endocrine disorders: Secondary | ICD-10-CM

## 2018-11-13 DIAGNOSIS — F419 Anxiety disorder, unspecified: Secondary | ICD-10-CM | POA: Diagnosis not present

## 2018-11-13 DIAGNOSIS — E785 Hyperlipidemia, unspecified: Secondary | ICD-10-CM

## 2018-11-13 DIAGNOSIS — E1169 Type 2 diabetes mellitus with other specified complication: Secondary | ICD-10-CM

## 2018-11-13 DIAGNOSIS — G4700 Insomnia, unspecified: Secondary | ICD-10-CM

## 2018-11-13 LAB — BAYER DCA HB A1C WAIVED: HB A1C (BAYER DCA - WAIVED): 9.8 % — ABNORMAL HIGH (ref <7.0)

## 2018-11-13 MED ORDER — GLIPIZIDE 5 MG PO TABS: 5.0000 mg | ORAL_TABLET | Freq: Two times a day (BID) | ORAL | 3 refills | Status: DC

## 2018-11-13 MED ORDER — TRIAZOLAM 0.125 MG PO TABS: 0.1250 mg | ORAL_TABLET | Freq: Every evening | ORAL | 2 refills | Status: DC | PRN

## 2018-11-13 NOTE — Patient Instructions (Addendum)
Use the Halcion as LITTLE as possible.  This medication is addictive and can cause falls, hallucinations, death.    Controlled Substance Guidelines:  1. You cannot get an early refill, even it is lost.  2. You cannot get pain medications from any other doctor, unless it is the emergency department and related to a new problem or injury.  3. You cannot use alcohol, marijuana, cocaine or any other recreational drugs while using this medication. This is very dangerous.  4. You are willing to have your urine drug tested at each visit.  5. You will not drive while using this medication, because that can put yourself and others in serious danger of an accident. 6. If any medication is stolen, then there must be a police report to verify it, or it cannot be refilled.  7. I will not prescribe these medications for longer than 3 months.  8. You must bring your pill bottle to each visit.  9. You must use the same pharmacy for all refills for the medication, unless you clear it with me beforehand.  10. You cannot share or sell this medication.  11. Always take a stool softener to prevent constipation.   You had labs performed today.  You will be contacted with the results of the labs once they are available, usually in the next 3 business days for routine lab work.  If you have an active my chart account, they will be released to your MyChart.  If you prefer to have these labs released to you via telephone, please let us know.  If you had a pap smear or biopsy performed, expect to be contacted in about 7-10 days.

## 2018-11-13 NOTE — Progress Notes (Signed)
Subjective: CC: insomnia PCP: [name], DO QMG:[id number] Alyssa Mitchell is a 68 y.o. female presenting to clinic today for:  1. Insomnia Patient is accompanied to the office today by her boyfriend.  She notes that she has had longstanding history of difficulty with sleep and uses almost nightly triazolam for help with this.  She notes that this seems to help with her sleep quite a bit.  She denies any history of falls, dizziness or confusion with this medicine.  Certainly no difficulty with breathing.  She is here for medication refill.  2.  Diabetes with hypertension and hyperlipidemia Patient reports compliance with metformin, Januvia, Cozaar 100 mg, Tenoretic 50-25 mg and Mevacor. She does not monitor blood sugars regularly nor check her blood pressure regularly at home.  No chest pain, shortness of breath, dizziness, falls.  Denies any sensation changes.  3.  Hypothyroidism Longstanding history of hypothyroidism.  She takes Synthroid 137 mcg daily.  She notes that she may be misses her medication 1 time per week but often does remember to take it.  Denies any change in voice, heart palpitations or unplanned weight changes   ROS: Per HPI  Allergies  Allergen Reactions  . Trimox [Amoxicillin] Rash   Past Medical History:  Diagnosis Date  . Diabetes mellitus without complication ([place])   . Hearing impaired person, bilateral    Hearing Aid left Ear  . Hypertension     Current Outpatient Medications:  .  atenolol-chlorthalidone (TENORETIC) 50-25 MG tablet, TAKE 1 TABLET DAILY, Disp: 90 tablet, Rfl: 1 .  levothyroxine (SYNTHROID, LEVOTHROID) 137 MCG tablet, Take 1 tablet (137 mcg total) by mouth daily., Disp: 90 tablet, Rfl: 0 .  losartan (COZAAR) 100 MG tablet, Take 1 tablet (100 mg total) by mouth daily., Disp: 90 tablet, Rfl: 1 .  lovastatin (MEVACOR) 20 MG tablet, Take 1 tablet (20 mg total) by mouth at bedtime., Disp: 90 tablet, Rfl: 1 .  metFORMIN (GLUCOPHAGE) 1000 MG tablet,  Take 1 tablet (1,000 mg total) by mouth 2 (two) times daily with a meal., Disp: 90 tablet, Rfl: 1 .  sitaGLIPtin (JANUVIA) 100 MG tablet, Take 1 tablet (100 mg total) by mouth daily., Disp: 90 tablet, Rfl: 1 .  triazolam (HALCION) 0.125 MG tablet, TAKE 1 TABLET (0.125 MG TOTAL) BY MOUTH AT BEDTIME AS NEEDED FOR SLEEP., Disp: 30 tablet, Rfl: 0 Social History   Socioeconomic History  . Marital status: Married    Spouse name: Not on file  . Number of children: Not on file  . Years of education: Not on file  . Highest education level: Not on file  Occupational History  . Not on file  Social Needs  . Financial resource strain: Not on file  . Food insecurity    Worry: Not on file    Inability: Not on file  . Transportation needs    Medical: Not on file    Non-medical: Not on file  Tobacco Use  . Smoking status: Never Smoker  . Smokeless tobacco: Never Used  Substance and Sexual Activity  . Alcohol use: No  . Drug use: No  . Sexual activity: Not on file  Lifestyle  . Physical activity    Days per week: Not on file    Minutes per session: Not on file  . Stress: Not on file  Relationships  . Social Herbalist on phone: Not on file    Gets together: Not on file    Attends religious  service: Not on file    Active member of club or organization: Not on file    Attends meetings of clubs or organizations: Not on file    Relationship status: Not on file  . Intimate partner violence    Fear of current or ex partner: Not on file    Emotionally abused: Not on file    Physically abused: Not on file    Forced sexual activity: Not on file  Other Topics Concern  . Not on file  Social History Narrative  . Not on file   Family History  Problem Relation Age of Onset  . Cancer Mother   . Cancer Father   . Cancer Maternal Grandmother     Objective: Office vital signs reviewed. BP (!) 162/80   Pulse 68   Temp 98.6 F (37 C) (Oral)   Ht 5' 6.5" (1.689 m)   Wt 228 lb  (103.4 kg)   BMI 36.25 kg/m   Physical Examination:  General: Awake, alert, well nourished, No acute distress HEENT: Normal, sclera white, MMM Cardio: regular rate and rhythm, S1S2 heard, no murmurs appreciated Pulm: clear to auscultation bilaterally, no wheezes, rhonchi or rales; normal work of breathing on room air Extremities: warm, well perfused, No edema, cyanosis or clubbing; +2 pulses bilaterally MSK: normal gait and station Skin: dry; intact; no rashes or lesions Neuro: no tremor.  Hard of hearing.  Results for orders placed or performed in visit on 11/13/18 (from the past 24 hour(s))  Bayer DCA Hb A1c Waived     Status: Abnormal   Collection Time: 11/13/18 11:27 AM  Result Value Ref Range   HB A1C (BAYER DCA - WAIVED) 9.8 (H) <7.0 %   Narrative   Performed at:  [address], [place], [place]  [id number] Lab Director: [place], Phone:  [phone]    Assessment/ Plan: 68 y.o. female   1. Type 2 diabetes mellitus without complication, without long-term current use of insulin (HCC) Continues to be uncontrolled though has improved from 13 last check.  Because symptoms continue to be uncontrolled despite max dose of metformin and Januvia, I have added glipizide to take twice daily with meals.  Plan to recheck in 3 months.  If persistently uncontrolled, may need to consider adding injectable. - Bayer DCA Hb A1c Waived  2. Hyperlipidemia associated with type 2 diabetes mellitus ([place]) Continue statin  3. Hypertension associated with diabetes ([employer]) Not controlled.  Uncertain if this is really reflective of her true blood pressures as at her last visit she was controlled.  We discussed that she should monitor blood pressures at home with goal of less than 150/90.  If she has persistently elevated blood pressures at home we will plan to add amlodipine  4. Acquired hypothyroidism Semi-compliant with medicines.  Check thyroid panel - Thyroid  Panel With TSH  5. Anxiety Controlled substance contract was reviewed today.  Red flags discussed.  The national narcotic database was reviewed today and there were no red flags.  Refills are appropriate.  Ideally, would prefer patient not to be on benzodiazepine given age and addictive nature of the medicine.  We will continue to work with her on this and try and transition her over to something else like mirtazapine or Belsomra if needed for sleep. - triazolam (HALCION) 0.125 MG tablet; Take 1 tablet (0.125 mg total) by mouth at bedtime as needed for sleep (or anxiety).  Dispense: 30 tablet; Refill: 2 - ToxASSURE  Select 13 (MW), Urine  6. Insomnia, unspecified type As above - triazolam (HALCION) 0.125 MG tablet; Take 1 tablet (0.125 mg total) by mouth at bedtime as needed for sleep (or anxiety).  Dispense: 30 tablet; Refill: 2 - ToxASSURE Select 13 (MW), Urine  7. Controlled substance agreement signed As above - ToxASSURE Select 13 (MW), Urine   Orders Placed This Encounter  Procedures  . Bayer DCA Hb A1c Waived  . Thyroid Panel With TSH  . ToxASSURE Select 13 (MW), Urine   Meds ordered this encounter  Medications  . triazolam (HALCION) 0.125 MG tablet    Sig: Take 1 tablet (0.125 mg total) by mouth at bedtime as needed for sleep (or anxiety).    Dispense:  30 tablet    Refill:  2  . glipiZIDE (GLUCOTROL) 5 MG tablet    Sig: Take 1 tablet (5 mg total) by mouth 2 (two) times daily before a meal.    Dispense:  60 tablet    Refill:  [place], [place] [phone]

## 2018-11-14 LAB — THYROID PANEL WITH TSH
Free Thyroxine Index: 2 (ref 1.2–4.9)
T3 Uptake Ratio: 24 % (ref 24–39)
T4, Total: 8.4 ug/dL (ref 4.5–12.0)
TSH: 7.58 u[IU]/mL — ABNORMAL HIGH (ref 0.450–4.500)

## 2018-11-16 ENCOUNTER — Other Ambulatory Visit: Payer: Self-pay | Admitting: Family Medicine

## 2018-11-16 MED ORDER — LEVOTHYROXINE SODIUM 150 MCG PO TABS: 150.0000 ug | ORAL_TABLET | Freq: Every day | ORAL | 0 refills | Status: DC

## 2018-11-17 LAB — TOXASSURE SELECT 13 (MW), URINE

## 2018-11-20 ENCOUNTER — Encounter: Payer: Self-pay | Admitting: *Deleted

## 2019-01-25 ENCOUNTER — Other Ambulatory Visit: Payer: Self-pay | Admitting: Family Medicine

## 2019-01-25 DIAGNOSIS — I10 Essential (primary) hypertension: Secondary | ICD-10-CM

## 2019-02-02 ENCOUNTER — Telehealth: Payer: Self-pay | Admitting: Family Medicine

## 2019-02-02 NOTE — Telephone Encounter (Signed)
This was fill 11/2018 w/ 2 additional fills.  She should not be completely out.  Furthermore, must be seen IN office for controlled substances.  This was reviewed during her last OV. Please make sure she is scheduling appt accordingly.

## 2019-02-03 NOTE — Telephone Encounter (Signed)
Patient aware.

## 2019-02-23 ENCOUNTER — Ambulatory Visit (INDEPENDENT_AMBULATORY_CARE_PROVIDER_SITE_OTHER): Payer: Federal, State, Local not specified - PPO | Admitting: Family

## 2019-02-23 ENCOUNTER — Encounter: Payer: Self-pay | Admitting: Family

## 2019-02-23 DIAGNOSIS — B86 Scabies: Secondary | ICD-10-CM

## 2019-02-23 MED ORDER — PERMETHRIN 5 % EX CREA: 1.0000 "application " | TOPICAL_CREAM | Freq: Once | CUTANEOUS | 0 refills | Status: AC

## 2019-02-23 NOTE — Progress Notes (Signed)
° °  Virtual Visit via telephone Note Due to COVID-19 pandemic this visit was conducted virtually. This visit type was conducted due to national recommendations for restrictions regarding the COVID-19 Pandemic (e.g. social distancing, sheltering in place) in an effort to limit this patient's exposure and mitigate transmission in our community. All issues noted in this document were discussed and addressed.  A physical exam was not performed with this format.  I connected with Alyssa Mitchell on 02/23/19 at 11:30 AM by telephone and verified that I am speaking with the correct person using two identifiers. Alyssa Mitchell is currently located at home and finance  is currently with her during visit. The provider, Evelina Dun, FNP is located in their office at time of visit.  I discussed the limitations, risks, security and privacy concerns of performing an evaluation and management service by telephone and the availability of in person appointments. I also discussed with the patient that there may be a patient responsible charge related to this service. The patient expressed understanding and agreed to proceed.   History and Present Illness:  Pt calls the office today for recurrent rash. She states she has had scabies in the past and this rash is similar. She also started amoxicillin last week on Monday, but reports her rash started on Sunday evening.  Rash This is a recurrent problem. The current episode started in the past 7 days. The problem has been gradually worsening since onset. The affected locations include the chest, abdomen, back, left lower leg, left upper leg, right lower leg and right upper leg. The rash is characterized by redness and itchiness. She was exposed to a new medication (started amoxicillin last monday). Pertinent negatives include no congestion, cough, diarrhea, fatigue, fever, shortness of breath or sore throat. Past treatments include anti-itch cream. The treatment provided mild  relief.      Review of Systems  Constitutional: Negative for fatigue and fever.  HENT: Negative for congestion and sore throat.   Respiratory: Negative for cough and shortness of breath.   Gastrointestinal: Negative for diarrhea.  Skin: Positive for rash.  All other systems reviewed and are negative.    Observations/Objective: No SOB or distress noted, interpreter used   Assessment and Plan: 1. Scabies Do not scratch  Wash all bedding and clothing in hot water Place all un-washable items in plastic bag for 3 days Call office if symptoms continue or worsen - permethrin (ELIMITE) 5 % cream; Apply 1 application topically once for 1 dose.  Dispense: 60 g; Refill: 0       I discussed the assessment and treatment plan with the patient. The patient was provided an opportunity to ask questions and all were answered. The patient agreed with the plan and demonstrated an understanding of the instructions.   The patient was advised to call back or seek an in-person evaluation if the symptoms worsen or if the condition fails to improve as anticipated.  The above assessment and management plan was discussed with the patient. The patient verbalized understanding of and has agreed to the management plan. Patient is aware to call the clinic if symptoms persist or worsen. Patient is aware when to return to the clinic for a follow-up visit. Patient educated on when it is appropriate to go to the emergency department.   Time call ended:  11:45 AM  I provided 15 minutes of non-face-to-face time during this encounter.    Evelina Dun, FNP

## 2019-02-25 ENCOUNTER — Ambulatory Visit (INDEPENDENT_AMBULATORY_CARE_PROVIDER_SITE_OTHER): Payer: Federal, State, Local not specified - PPO | Admitting: Family Medicine

## 2019-02-25 ENCOUNTER — Encounter: Payer: Self-pay | Admitting: Family Medicine

## 2019-02-25 DIAGNOSIS — B86 Scabies: Secondary | ICD-10-CM

## 2019-02-25 MED ORDER — EURAX 10 % EX CREA: TOPICAL_CREAM | Freq: Every day | CUTANEOUS | 0 refills | Status: AC

## 2019-02-25 MED ORDER — IVERMECTIN 3 MG PO TABS: 3.0000 mg | ORAL_TABLET | Freq: Once | ORAL | 0 refills | Status: AC

## 2019-02-25 NOTE — Progress Notes (Signed)
Subjective:    Patient ID: LIBRA BOOKBINDER, female    DOB: [birth year], 68 y.o.   MRN: [id number]   HPI: ALIJA CANTRELLE is a 68 y.o. female presenting for rash not improving. Onset 4 days ago. A little better. Still broken out and itching. Treated two days ago. No better. Wants a stronger cream. Ms. Lenna Gilford recommended OTC permethrin. It was just 5% she says, so she needs something stronger. Body covered with red papules that are very pruritic. None on scalp.    Depression screen [facility] 2/9 11/13/2018 09/21/2018 09/14/2018 07/21/2018 06/09/2017  Decreased Interest 0 0 0 0 0  Down, Depressed, Hopeless 0 0 0 0 0  PHQ - 2 Score 0 0 0 0 0  Altered sleeping 0 0 0 0 -  Tired, decreased energy 0 0 0 0 -  Change in appetite 0 0 0 0 -  Feeling bad or failure about yourself  0 0 0 0 -  Trouble concentrating 0 0 0 0 -  Moving slowly or fidgety/restless 0 0 0 0 -  Suicidal thoughts 0 0 0 0 -  PHQ-9 Score 0 0 0 0 -     Relevant past medical, surgical, family and social history reviewed and updated as indicated.  Interim medical history since our last visit reviewed. Allergies and medications reviewed and updated.  ROS:  Review of Systems  Constitutional: Negative.  Negative for fever.  HENT: Negative.  Negative for congestion.   Eyes: Negative for visual disturbance.  Respiratory: Negative for cough and shortness of breath.   Cardiovascular: Negative for chest pain.  Gastrointestinal: Negative for abdominal pain and diarrhea.  Musculoskeletal: Negative for arthralgias.     Social History   Tobacco Use  Smoking Status Never Smoker  Smokeless Tobacco Never Used       Objective:     Wt Readings from Last 3 Encounters:  11/13/18 228 lb (103.4 kg)  09/21/18 223 lb (101.2 kg)  09/14/18 226 lb (102.5 kg)     Exam deferred. Pt. Harboring due to COVID 19. Phone visit performed.   Assessment & Plan:   1. Scabies     Meds ordered this encounter  Medications  . crotamiton (EURAX) 10 % cream     Sig: Apply topically daily.    Dispense:  60 g    Refill:  0  . ivermectin (STROMECTOL) 3 MG TABS tablet    Sig: Take 1 tablet (3 mg total) by mouth once for 1 dose. Today, repeat in 2 days    Dispense:  2 tablet    Refill:  0    No orders of the defined types were placed in this encounter.     Diagnoses and all orders for this visit:  Scabies  Other orders -     crotamiton (EURAX) 10 % cream; Apply topically daily. -     ivermectin (STROMECTOL) 3 MG TABS tablet; Take 1 tablet (3 mg total) by mouth once for 1 dose. Today, repeat in 2 days    Virtual Visit via telephone Note  I discussed the limitations, risks, security and privacy concerns of performing an evaluation and management service by telephone and the availability of in person appointments. The patient was identified with two identifiers. Pt.expressed understanding and agreed to proceed. Pt. Is at home. Dr. Livia Snellen is in his office.  Follow Up Instructions:   I discussed the assessment and treatment plan with the patient. The patient was provided an opportunity to ask  questions and all were answered. The patient agreed with the plan and demonstrated an understanding of the instructions.   The patient was advised to call back or seek an in-person evaluation if the symptoms worsen or if the condition fails to improve as anticipated.   Total minutes including chart review and phone contact time: 12   Follow up plan: Return if symptoms worsen or fail to improve.  Claretta Fraise, MD [place]

## 2019-02-26 ENCOUNTER — Telehealth: Payer: Self-pay | Admitting: Family Medicine

## 2019-02-26 NOTE — Telephone Encounter (Signed)
If she truly has scabies the elimite cream should have worked. Then need to be sen to see what the rash looks like- J. Bullins informed patient.

## 2019-02-26 NOTE — Telephone Encounter (Signed)
Its pending provider

## 2019-02-26 NOTE — Telephone Encounter (Signed)
Visit was with [facility] yesterday = you are back up provider.

## 2019-02-26 NOTE — Telephone Encounter (Signed)
MMM aware to check on this

## 2019-04-13 ENCOUNTER — Other Ambulatory Visit: Payer: Self-pay | Admitting: Family Medicine

## 2019-04-13 DIAGNOSIS — G4700 Insomnia, unspecified: Secondary | ICD-10-CM

## 2019-04-13 DIAGNOSIS — F419 Anxiety disorder, unspecified: Secondary | ICD-10-CM

## 2019-04-15 ENCOUNTER — Other Ambulatory Visit: Payer: Self-pay | Admitting: Family Medicine

## 2019-04-15 DIAGNOSIS — F419 Anxiety disorder, unspecified: Secondary | ICD-10-CM

## 2019-04-15 DIAGNOSIS — G4700 Insomnia, unspecified: Secondary | ICD-10-CM

## 2019-04-22 ENCOUNTER — Other Ambulatory Visit: Payer: Self-pay | Admitting: Family Medicine

## 2019-04-22 DIAGNOSIS — F419 Anxiety disorder, unspecified: Secondary | ICD-10-CM

## 2019-04-22 DIAGNOSIS — G4700 Insomnia, unspecified: Secondary | ICD-10-CM

## 2019-06-07 ENCOUNTER — Ambulatory Visit: Payer: Federal, State, Local not specified - PPO | Admitting: Family Medicine

## 2019-06-07 ENCOUNTER — Ambulatory Visit (INDEPENDENT_AMBULATORY_CARE_PROVIDER_SITE_OTHER): Payer: Federal, State, Local not specified - PPO

## 2019-06-07 ENCOUNTER — Encounter: Payer: Self-pay | Admitting: Family Medicine

## 2019-06-07 ENCOUNTER — Other Ambulatory Visit: Payer: Self-pay

## 2019-06-07 VITALS — BP 175/80 | HR 70 | Temp 97.1°F | Resp 20 | Ht 66.5 in | Wt 238.0 lb

## 2019-06-07 DIAGNOSIS — M25561 Pain in right knee: Secondary | ICD-10-CM | POA: Diagnosis not present

## 2019-06-07 DIAGNOSIS — W19XXXA Unspecified fall, initial encounter: Secondary | ICD-10-CM | POA: Diagnosis not present

## 2019-06-07 MED ORDER — DICLOFENAC SODIUM 1 % EX GEL: 2.0000 g | Freq: Four times a day (QID) | CUTANEOUS | 0 refills | Status: DC

## 2019-06-07 NOTE — Progress Notes (Signed)
Subjective:  Patient ID: Alyssa Mitchell, female    DOB: [birth year], 69 y.o.   MRN: [id number]  Patient Care Team: [name], DO as PCP - General (Family Medicine)   Chief Complaint:  Lytle Michaels [phone] ) and right knee pain   HPI: Alyssa Mitchell is a 69 y.o. female presenting on 06/07/2019 for Fall (wednesday ) and right knee pain   Pt states she fell on the ice Wednesday night. States her knee continues to bother. Her. States she has been taking Tylenol and using ice with some relief of symptoms.   Fall The accident occurred 3 to 5 days ago. The fall occurred while walking. She fell from a height of 1 to 2 ft. Impact surface: ice. There was no blood loss. The point of impact was the right knee. The pain is present in the right knee. The pain is at a severity of 4/10. The pain is mild. The symptoms are aggravated by flexion, extension, movement and ambulation. Pertinent negatives include no abdominal pain, bowel incontinence, fever, headaches, hearing loss, hematuria, loss of consciousness, nausea, numbness, tingling, visual change or vomiting. She has tried acetaminophen for the symptoms. The treatment provided mild relief.       Relevant past medical, surgical, family, and social history reviewed and updated as indicated.  Allergies and medications reviewed and updated. Date reviewed: Chart in Epic.   Past Medical History:  Diagnosis Date  . Diabetes mellitus without complication ([place])   . Hearing impaired person, bilateral    Hearing Aid left Ear  . Hypertension     Past Surgical History:  Procedure Laterality Date  . CESAREAN SECTION    . MASTECTOMY      Social History   Socioeconomic History  . Marital status: Married    Spouse name: Not on file  . Number of children: Not on file  . Years of education: Not on file  . Highest education level: Not on file  Occupational History  . Not on file  Tobacco Use  . Smoking status: Never Smoker  . Smokeless tobacco:  Never Used  Substance and Sexual Activity  . Alcohol use: No  . Drug use: No  . Sexual activity: Not on file  Other Topics Concern  . Not on file  Social History Narrative  . Not on file   Social Determinants of Health   Financial Resource Strain:   . Difficulty of Paying Living Expenses: Not on file  Food Insecurity:   . Worried About Charity fundraiser in the Last Year: Not on file  . Ran Out of Food in the Last Year: Not on file  Transportation Needs:   . Lack of Transportation (Medical): Not on file  . Lack of Transportation (Non-Medical): Not on file  Physical Activity:   . Days of Exercise per Week: Not on file  . Minutes of Exercise per Session: Not on file  Stress:   . Feeling of Stress : Not on file  Social Connections:   . Frequency of Communication with Friends and Family: Not on file  . Frequency of Social Gatherings with Friends and Family: Not on file  . Attends Religious Services: Not on file  . Active Member of Clubs or Organizations: Not on file  . Attends Archivist Meetings: Not on file  . Marital Status: Not on file  Intimate Partner Violence:   . Fear of Current or Ex-Partner: Not on file  . Emotionally Abused:  Not on file  . Physically Abused: Not on file  . Sexually Abused: Not on file    Outpatient Encounter Medications as of 06/07/2019  Medication Sig  . atenolol-chlorthalidone (TENORETIC) 50-25 MG tablet TAKE 1 TABLET BY MOUTH ONCE DAILY  . glipiZIDE (GLUCOTROL) 5 MG tablet Take 1 tablet (5 mg total) by mouth 2 (two) times daily before a meal.  . levothyroxine (SYNTHROID) 150 MCG tablet Take 1 tablet (150 mcg total) by mouth daily.  Marland Kitchen losartan (COZAAR) 100 MG tablet Take 1 tablet (100 mg total) by mouth daily.  Marland Kitchen lovastatin (MEVACOR) 20 MG tablet Take 1 tablet (20 mg total) by mouth at bedtime.  . metFORMIN (GLUCOPHAGE) 1000 MG tablet Take 1 tablet (1,000 mg total) by mouth 2 (two) times daily with a meal.  . sitaGLIPtin (JANUVIA) 100  MG tablet Take 1 tablet (100 mg total) by mouth daily.  . triazolam (HALCION) 0.125 MG tablet Take 1 tablet (0.125 mg total) by mouth at bedtime as needed for sleep (or anxiety).  . crotamiton (EURAX) 10 % cream Apply topically daily. (Patient not taking: Reported on 06/07/2019)  . diclofenac Sodium (VOLTAREN) 1 % GEL Apply 2 g topically 4 (four) times daily.   No facility-administered encounter medications on file as of 06/07/2019.    Allergies  Allergen Reactions  . Trimox [Amoxicillin] Rash    Review of Systems  Constitutional: Negative for activity change, appetite change, chills, fatigue and fever.  HENT: Positive for hearing loss.   Eyes: Negative.  Negative for photophobia and visual disturbance.  Respiratory: Negative for cough, chest tightness and shortness of breath.   Cardiovascular: Negative for chest pain, palpitations and leg swelling.  Gastrointestinal: Negative for abdominal distention, abdominal pain, blood in stool, bowel incontinence, constipation, diarrhea, nausea and vomiting.  Endocrine: Negative.   Genitourinary: Negative for decreased urine volume, dysuria, frequency, hematuria and urgency.  Musculoskeletal: Positive for arthralgias, gait problem and joint swelling. Negative for myalgias.  Skin: Negative.   Allergic/Immunologic: Negative.   Neurological: Negative for dizziness, tingling, tremors, seizures, loss of consciousness, syncope, facial asymmetry, speech difficulty, weakness, light-headedness, numbness and headaches.  Hematological: Negative.   Psychiatric/Behavioral: Negative for confusion, hallucinations, sleep disturbance and suicidal ideas.  All other systems reviewed and are negative.       Objective:  BP (!) 175/80 (Cuff Size: Normal)   Pulse 70   Temp (!) 97.1 F (36.2 C)   Resp 20   Ht 5' 6.5" (1.689 m)   Wt 238 lb (108 kg)   SpO2 99%   BMI 37.84 kg/m    Wt Readings from Last 3 Encounters:  06/07/19 238 lb (108 kg)  11/13/18 228 lb  (103.4 kg)  09/21/18 223 lb (101.2 kg)    Physical Exam Vitals and nursing note reviewed.  Constitutional:      General: She is not in acute distress.    Appearance: Normal appearance. She is well-developed and well-groomed. She is not ill-appearing, toxic-appearing or diaphoretic.  HENT:     Head: Normocephalic and atraumatic.     Jaw: There is normal jaw occlusion.     Right Ear: Decreased hearing noted.     Left Ear: Decreased hearing noted.     Nose: Nose normal.     Mouth/Throat:     Lips: Pink.     Mouth: Mucous membranes are moist.     Pharynx: Oropharynx is clear. Uvula midline.  Eyes:     General: Lids are normal.     Extraocular  Movements: Extraocular movements intact.     Conjunctiva/sclera: Conjunctivae normal.     Pupils: Pupils are equal, round, and reactive to light.  Neck:     Thyroid: No thyroid mass, thyromegaly or thyroid tenderness.     Vascular: No carotid bruit or JVD.     Trachea: Trachea and phonation normal.  Cardiovascular:     Rate and Rhythm: Normal rate and regular rhythm.     Chest Wall: PMI is not displaced.     Pulses: Normal pulses.     Heart sounds: Normal heart sounds. No murmur. No friction rub. No gallop.   Pulmonary:     Effort: Pulmonary effort is normal. No respiratory distress.     Breath sounds: Normal breath sounds. No wheezing.  Abdominal:     General: Bowel sounds are normal. There is no distension or abdominal bruit.     Palpations: Abdomen is soft. There is no hepatomegaly or splenomegaly.     Tenderness: There is no abdominal tenderness. There is no right CVA tenderness or left CVA tenderness.     Hernia: No hernia is present.  Musculoskeletal:     Cervical back: Normal range of motion and neck supple.     Right hip: Normal.     Right upper leg: Normal.     Right knee: No swelling, deformity, effusion, erythema, ecchymosis, lacerations, bony tenderness or crepitus. Decreased range of motion (extension due to pain).  Tenderness present over the medial joint line and lateral joint line. No MCL, LCL, ACL, PCL or patellar tendon tenderness. No LCL laxity, MCL laxity, ACL laxity or PCL laxity. Normal alignment, normal meniscus and normal patellar mobility. Normal pulse.     Left knee: Normal.     Right lower leg: Normal. No edema.     Left lower leg: No edema.  Lymphadenopathy:     Cervical: No cervical adenopathy.  Skin:    General: Skin is warm and dry.     Capillary Refill: Capillary refill takes less than 2 seconds.     Coloration: Skin is not cyanotic, jaundiced or pale.     Findings: No rash.  Neurological:     General: No focal deficit present.     Mental Status: She is alert and oriented to person, place, and time.     Cranial Nerves: Cranial nerves are intact.     Sensory: Sensation is intact.     Motor: Motor function is intact.     Coordination: Coordination is intact.     Gait: Gait abnormal (antalgic).     Deep Tendon Reflexes: Reflexes are normal and symmetric.  Psychiatric:        Attention and Perception: Attention and perception normal.        Mood and Affect: Mood and affect normal.        Speech: Speech normal.        Behavior: Behavior normal. Behavior is cooperative.        Thought Content: Thought content normal.        Cognition and Memory: Cognition and memory normal.        Judgment: Judgment normal.     Results for orders placed or performed in visit on 11/13/18  Bayer DCA Hb A1c Waived  Result Value Ref Range   HB A1C (BAYER DCA - WAIVED) 9.8 (H) <7.0 %  Thyroid Panel With TSH  Result Value Ref Range   TSH 7.580 (H) 0.450 - 4.500 uIU/mL   T4, Total 8.4 4.5 -  12.0 ug/dL   T3 Uptake Ratio 24 24 - 39 %   Free Thyroxine Index 2.0 1.2 - 4.9  ToxASSURE Select 13 (MW), Urine  Result Value Ref Range   Summary Note      X-Ray: right knee: No acute findings, minimal joint space narrowing. Preliminary x-ray reading by Monia Pouch, FNP-C, [facility].   Pertinent labs &  imaging results that were available during my care of the patient were reviewed by me and considered in my medical decision making.  Assessment & Plan:  Charmagne was seen today for fall and right knee pain.  Diagnoses and all orders for this visit:  Fall, initial encounter Acute pain of right knee Xray without acute findings. ROM without laxity. No effusion. RICE therapy discussed in detail. Topical NSAID as prescribed. Can continue tylenol as needed. Knee brace applied in office. Report any new, worsening, or persistent symptoms. Will notify pt if radiology reading differs.  -     DG Knee 1-2 Views Right; Future -     diclofenac Sodium (VOLTAREN) 1 % GEL; Apply 2 g topically 4 (four) times daily.  Blood pressure elevated in office. Pt aware to monitor at home and report any persistent high readings.   Continue all other maintenance medications.  Follow up plan: Return in about 4 weeks (around 07/05/2019), or if symptoms worsen or fail to improve, for knee pain.  Continue healthy lifestyle choices, including diet (rich in fruits, vegetables, and lean proteins, and low in salt and simple carbohydrates) and exercise (at least 30 minutes of moderate physical activity daily).  Educational handout given for knee pain  The above assessment and management plan was discussed with the patient. The patient verbalized understanding of and has agreed to the management plan. Patient is aware to call the clinic if they develop any new symptoms or if symptoms persist or worsen. Patient is aware when to return to the clinic for a follow-up visit. Patient educated on when it is appropriate to go to the emergency department.   Monia Pouch, FNP-C [facility] [phone]

## 2019-06-07 NOTE — Patient Instructions (Signed)

## 2019-06-10 ENCOUNTER — Ambulatory Visit: Payer: Medicare Other | Admitting: Family Medicine

## 2019-06-10 ENCOUNTER — Other Ambulatory Visit: Payer: Self-pay

## 2019-06-10 VITALS — BP 137/67 | HR 69 | Temp 96.8°F | Ht 66.5 in | Wt 239.0 lb

## 2019-06-10 DIAGNOSIS — Z91199 Patient's noncompliance with other medical treatment and regimen due to unspecified reason: Secondary | ICD-10-CM

## 2019-06-10 DIAGNOSIS — I10 Essential (primary) hypertension: Secondary | ICD-10-CM

## 2019-06-10 DIAGNOSIS — F419 Anxiety disorder, unspecified: Secondary | ICD-10-CM

## 2019-06-10 DIAGNOSIS — E039 Hypothyroidism, unspecified: Secondary | ICD-10-CM | POA: Diagnosis not present

## 2019-06-10 DIAGNOSIS — G4700 Insomnia, unspecified: Secondary | ICD-10-CM

## 2019-06-10 DIAGNOSIS — E1159 Type 2 diabetes mellitus with other circulatory complications: Secondary | ICD-10-CM | POA: Diagnosis not present

## 2019-06-10 DIAGNOSIS — E119 Type 2 diabetes mellitus without complications: Secondary | ICD-10-CM

## 2019-06-10 DIAGNOSIS — E1169 Type 2 diabetes mellitus with other specified complication: Secondary | ICD-10-CM | POA: Diagnosis not present

## 2019-06-10 DIAGNOSIS — Z9119 Patient's noncompliance with other medical treatment and regimen: Secondary | ICD-10-CM

## 2019-06-10 DIAGNOSIS — E785 Hyperlipidemia, unspecified: Secondary | ICD-10-CM

## 2019-06-10 LAB — BAYER DCA HB A1C WAIVED: HB A1C (BAYER DCA - WAIVED): 8.9 % — ABNORMAL HIGH (ref <7.0)

## 2019-06-10 MED ORDER — LOVASTATIN 20 MG PO TABS: 20.0000 mg | ORAL_TABLET | Freq: Every day | ORAL | 1 refills | Status: AC

## 2019-06-10 MED ORDER — METFORMIN HCL 1000 MG PO TABS: 1000.0000 mg | ORAL_TABLET | Freq: Two times a day (BID) | ORAL | 1 refills | Status: DC

## 2019-06-10 MED ORDER — LOSARTAN POTASSIUM 100 MG PO TABS: 100.0000 mg | ORAL_TABLET | Freq: Every day | ORAL | 1 refills | Status: AC

## 2019-06-10 MED ORDER — SITAGLIPTIN PHOSPHATE 100 MG PO TABS: 100.0000 mg | ORAL_TABLET | Freq: Every day | ORAL | 1 refills | Status: AC

## 2019-06-10 MED ORDER — ATENOLOL-CHLORTHALIDONE 50-25 MG PO TABS: ORAL_TABLET | ORAL | 1 refills | Status: AC

## 2019-06-10 MED ORDER — GLIPIZIDE 10 MG PO TABS: 10.0000 mg | ORAL_TABLET | Freq: Two times a day (BID) | ORAL | 0 refills | Status: AC

## 2019-06-10 MED ORDER — TRIAZOLAM 0.125 MG PO TABS: 0.1250 mg | ORAL_TABLET | Freq: Every evening | ORAL | 2 refills | Status: DC | PRN

## 2019-06-10 MED ORDER — LEVOTHYROXINE SODIUM 150 MCG PO TABS: 150.0000 ug | ORAL_TABLET | Freq: Every day | ORAL | 0 refills | Status: AC

## 2019-06-10 NOTE — Progress Notes (Signed)
Subjective: CC: insomnia, diabetes PCP: [name], DO Alyssa Mitchell is a 69 y.o. female presenting to clinic today for:  1. Insomnia Patient is accompanied to the office today by her boyfriend.  She has been out of the medication for several months due to lack of follow up.  She reports difficulty sleeping as a result.  Denies excessive daytime sleepiness, falls, mental status changes when she was on the medication.  2.  Diabetes with hypertension and hyperlipidemia Patient reports compliance with metformin, Januvia, Glipizide 92m BID, Cozaar 100 mg, Tenoretic 50-25 mg and Mevacor.  Lab Results  Component Value Date   HGBA1C 9.8 (H) 11/13/2018   She does not monitor blood sugars. No chest pain, shortness of breath, dizziness.  Denies any sensation changes.  She fell on ice last week and hurt her right knee.  She is RICE and using voltaren gel.  It is getting better  3.  Hypothyroidism Longstanding history of hypothyroidism.    She takes Synthroid 150 mcg daily sporadically.  She often forgets to take it.  Denies any change in voice, heart palpitations or unplanned weight changes   ROS: Per HPI  Allergies  Allergen Reactions  . Trimox [Amoxicillin] Rash   Past Medical History:  Diagnosis Date  . Diabetes mellitus without complication ([place]   . Hearing impaired person, bilateral    Hearing Aid left Ear  . Hypertension     Current Outpatient Medications:  .  atenolol-chlorthalidone (TENORETIC) 50-25 MG tablet, TAKE 1 TABLET BY MOUTH ONCE DAILY, Disp: 90 tablet, Rfl: 1 .  crotamiton (EURAX) 10 % cream, Apply topically daily., Disp: 60 g, Rfl: 0 .  diclofenac Sodium (VOLTAREN) 1 % GEL, Apply 2 g topically 4 (four) times daily., Disp: 350 g, Rfl: 0 .  glipiZIDE (GLUCOTROL) 5 MG tablet, Take 1 tablet (5 mg total) by mouth 2 (two) times daily before a meal., Disp: 60 tablet, Rfl: 3 .  levothyroxine (SYNTHROID) 150 MCG tablet, Take 1 tablet (150 mcg total) by mouth  daily., Disp: 90 tablet, Rfl: 0 .  losartan (COZAAR) 100 MG tablet, Take 1 tablet (100 mg total) by mouth daily., Disp: 90 tablet, Rfl: 1 .  lovastatin (MEVACOR) 20 MG tablet, Take 1 tablet (20 mg total) by mouth at bedtime., Disp: 90 tablet, Rfl: 1 .  metFORMIN (GLUCOPHAGE) 1000 MG tablet, Take 1 tablet (1,000 mg total) by mouth 2 (two) times daily with a meal., Disp: 90 tablet, Rfl: 1 .  sitaGLIPtin (JANUVIA) 100 MG tablet, Take 1 tablet (100 mg total) by mouth daily., Disp: 90 tablet, Rfl: 1 .  triazolam (HALCION) 0.125 MG tablet, Take 1 tablet (0.125 mg total) by mouth at bedtime as needed for sleep (or anxiety)., Disp: 30 tablet, Rfl: 2 Social History   Socioeconomic History  . Marital status: Married    Spouse name: Not on file  . Number of children: Not on file  . Years of education: Not on file  . Highest education level: Not on file  Occupational History  . Not on file  Tobacco Use  . Smoking status: Never Smoker  . Smokeless tobacco: Never Used  Substance and Sexual Activity  . Alcohol use: No  . Drug use: No  . Sexual activity: Not on file  Other Topics Concern  . Not on file  Social History Narrative  . Not on file   Social Determinants of Health   Financial Resource Strain:   . Difficulty of Paying Living Expenses: Not  on file  Food Insecurity:   . Worried About Charity fundraiser in the Last Year: Not on file  . Ran Out of Food in the Last Year: Not on file  Transportation Needs:   . Lack of Transportation (Medical): Not on file  . Lack of Transportation (Non-Medical): Not on file  Physical Activity:   . Days of Exercise per Week: Not on file  . Minutes of Exercise per Session: Not on file  Stress:   . Feeling of Stress : Not on file  Social Connections:   . Frequency of Communication with Friends and Family: Not on file  . Frequency of Social Gatherings with Friends and Family: Not on file  . Attends Religious Services: Not on file  . Active Member of  Clubs or Organizations: Not on file  . Attends Archivist Meetings: Not on file  . Marital Status: Not on file  Intimate Partner Violence:   . Fear of Current or Ex-Partner: Not on file  . Emotionally Abused: Not on file  . Physically Abused: Not on file  . Sexually Abused: Not on file   Family History  Problem Relation Age of Onset  . Cancer Mother   . Cancer Father   . Cancer Maternal Grandmother     Objective: Office vital signs reviewed. BP 137/67   Pulse 69   Temp (!) 96.8 F (36 C) (Temporal)   Ht 5' 6.5" (1.689 m)   Wt 239 lb (108.4 kg)   SpO2 97%   BMI 38.00 kg/m   Physical Examination:  General: Awake, alert, well nourished, No acute distress HEENT: Normal, sclera white, MMM; no exophthalmos.  No goiter Cardio: regular rate and rhythm, S1S2 heard, no murmurs appreciated Pulm: clear to auscultation bilaterally, no wheezes, rhonchi or rales; normal work of breathing on room air Extremities: warm, well perfused, trace ankle edema.  No cyanosis or clubbing; +2 pulses bilaterally MSK: Antalgic gait and station; mild soft tissue swelling and joint effusion noted on the right knee.  No skin breakdown.  Patient is ambulating independently Skin: dry; intact; no rashes or lesions; normal temperature Neuro: Medically deaf   Assessment/ Plan: 69 y.o. female   1. Type 2 diabetes mellitus without complication, without long-term current use of insulin ([place]) I question noncompliance with regimen as her refills are not consistent.  However, her A1c is continuing to improve albeit not at goal.  It was 8.9 today.  We discussed her goal is 7 or less.  I have renewed all of her medications today and asked that she follow-up in 3 months for repeat blood sugar.  I have also increased glipizide to 10 mg twice daily.  She is aware of change and will follow up accordingly - Bayer DCA Hb A1c Waived - CMP14+EGFR - sitaGLIPtin (JANUVIA) 100 MG tablet; Take 1 tablet (100 mg total)  by mouth daily.  Dispense: 90 tablet; Refill: 1 - metFORMIN (GLUCOPHAGE) 1000 MG tablet; Take 1 tablet (1,000 mg total) by mouth 2 (two) times daily with a meal.  Dispense: 90 tablet; Refill: 1 - glipiZIDE (GLUCOTROL) 10 MG tablet; Take 1 tablet (10 mg total) by mouth 2 (two) times daily before a meal.  Dispense: 180 tablet; Refill: 0  2. Hyperlipidemia associated with type 2 diabetes mellitus ([facility]) - CMP14+EGFR - lovastatin (MEVACOR) 20 MG tablet; Take 1 tablet (20 mg total) by mouth at bedtime.  Dispense: 90 tablet; Refill: 1  3. Hypertension associated with diabetes ([place]) Controlled. - CMP14+EGFR -  losartan (COZAAR) 100 MG tablet; Take 1 tablet (100 mg total) by mouth daily.  Dispense: 90 tablet; Refill: 1 - atenolol-chlorthalidone (TENORETIC) 50-25 MG tablet; TAKE 1 TABLET BY MOUTH ONCE DAILY  Dispense: 90 tablet; Refill: 1  4. Acquired hypothyroidism - Thyroid Panel With TSH - levothyroxine (SYNTHROID) 150 MCG tablet; Take 1 tablet (150 mcg total) by mouth daily.  Dispense: 90 tablet; Refill: 0  5. Insomnia, unspecified type Not well controlled due to lapse in medication.  We discussed that if she needs this medication going forward she has to be seen in the office as it is a controlled substance.  This was reiterated in her AVS. - Thyroid Panel With TSH - triazolam (HALCION) 0.125 MG tablet; Take 1 tablet (0.125 mg total) by mouth at bedtime as needed for sleep (or anxiety).  Dispense: 30 tablet; Refill: 2  6. Anxiety - triazolam (HALCION) 0.125 MG tablet; Take 1 tablet (0.125 mg total) by mouth at bedtime as needed for sleep (or anxiety).  Dispense: 30 tablet; Refill: 2  7.  Non-compliance Noncompliance with medications.   Orders Placed This Encounter  Procedures  . Bayer DCA Hb A1c Waived  . Thyroid Panel With TSH  . CMP14+EGFR   No orders of the defined types were placed in this encounter.    [name], DO [facility] [phone]

## 2019-06-10 NOTE — Patient Instructions (Addendum)
Your sugar is getting better but still not at goal.  Your goal A1c is 7.0.  Today it was 8.9.  I have increased the dose of your Glipizide to 10mg  twice daily.  Continue the Januvia and Metformin.  See me back in 3 months.   Controlled Substance Guidelines:  1. You cannot get an early refill, even it is lost.  2. You cannot get controlled medications from any other doctor, unless it is the emergency department and related to a new problem or injury.  3. You cannot use alcohol, marijuana, cocaine or any other recreational drugs while using this medication. This is very dangerous.  4. You are willing to have your urine drug tested at each visit.  5. You will not drive while using this medication, because that can put yourself and others in serious danger of an accident. 6. If any medication is stolen, then there must be a police report to verify it, or it cannot be refilled.  7. I will not prescribe these medications for longer than 3 months. You must come in for refills every 3 months 8. You must bring your pill bottle to each visit.  9. You must use the same pharmacy for all refills for the medication, unless you clear it with me beforehand.  10. You cannot share or sell this medication.

## 2019-06-11 LAB — THYROID PANEL WITH TSH
Free Thyroxine Index: 2.2 (ref 1.2–4.9)
T3 Uptake Ratio: 25 % (ref 24–39)
T4, Total: 8.6 ug/dL (ref 4.5–12.0)
TSH: 6.1 u[IU]/mL — ABNORMAL HIGH (ref 0.450–4.500)

## 2019-06-11 LAB — CMP14+EGFR
ALT: 16 IU/L (ref 0–32)
AST: 21 IU/L (ref 0–40)
Albumin/Globulin Ratio: 1.2 (ref 1.2–2.2)
Albumin: 4.1 g/dL (ref 3.8–4.8)
Alkaline Phosphatase: 99 IU/L (ref 39–117)
BUN/Creatinine Ratio: 19 (ref 12–28)
BUN: 15 mg/dL (ref 8–27)
Bilirubin Total: 0.3 mg/dL (ref 0.0–1.2)
CO2: 25 mmol/L (ref 20–29)
Calcium: 9.8 mg/dL (ref 8.7–10.3)
Chloride: 100 mmol/L (ref 96–106)
Creatinine, Ser: 0.79 mg/dL (ref 0.57–1.00)
GFR calc Af Amer: 89 mL/min/{1.73_m2} (ref >59)
GFR calc non Af Amer: 77 mL/min/{1.73_m2} (ref >59)
Globulin, Total: 3.5 g/dL (ref 1.5–4.5)
Glucose: 256 mg/dL — ABNORMAL HIGH (ref 65–99)
Potassium: 4.3 mmol/L (ref 3.5–5.2)
Sodium: 140 mmol/L (ref 134–144)
Total Protein: 7.6 g/dL (ref 6.0–8.5)

## 2019-06-28 ENCOUNTER — Telehealth: Payer: Self-pay | Admitting: Family Medicine

## 2019-06-28 NOTE — Telephone Encounter (Signed)
Pt and fiance called wanting advise on what to do about her leg that Dr Thayer Ohm has recently tried treating her for. Says she had the xray, is wearing the brace, and using the cream, but says it still hurts in the back of her leg (behind knee cap).

## 2019-06-28 NOTE — Telephone Encounter (Signed)
Imaging was negative. If she continues to have pain, she will need to be reevaluated to see if additional imaging is needed.

## 2019-06-28 NOTE — Telephone Encounter (Signed)
Attempted to contact patient- mailbox full  

## 2019-06-29 ENCOUNTER — Other Ambulatory Visit: Payer: Self-pay | Admitting: Family Medicine

## 2019-06-29 DIAGNOSIS — M25561 Pain in right knee: Secondary | ICD-10-CM

## 2019-06-29 NOTE — Telephone Encounter (Signed)
Confirmed with pt that xray was negative. She does continue to have pain but does not want to make an appt at this time. Instructed pt if she is not allergic that she may take Tylenol or IBU. Rotate heat and ice. Also to rest the leg. Pt and husband voice understanding. Pt states that the brace is uncomfortable. Made pt aware that it is not supposed to fit too tight but it needs to fit and it would help her to wear it. They will call back for an appt if needed before the May appt that is already scheduled.

## 2019-07-06 DIAGNOSIS — Z029 Encounter for administrative examinations, unspecified: Secondary | ICD-10-CM

## 2019-07-08 ENCOUNTER — Telehealth: Payer: Self-pay | Admitting: Family Medicine

## 2019-07-08 NOTE — Telephone Encounter (Signed)
Patient saw Monia Pouch one month ago for knee pain after a fall.  She was instructed if pain was not any better in a month to come back in.  Pain has not improved.  Appointment scheduled with Sharyn Lull tomorrow at 2:05 pm for follow up.

## 2019-07-09 ENCOUNTER — Other Ambulatory Visit: Payer: Self-pay

## 2019-07-09 ENCOUNTER — Encounter: Payer: Self-pay | Admitting: Family Medicine

## 2019-07-09 ENCOUNTER — Ambulatory Visit (INDEPENDENT_AMBULATORY_CARE_PROVIDER_SITE_OTHER): Payer: Federal, State, Local not specified - PPO | Admitting: Family Medicine

## 2019-07-09 VITALS — BP 176/73 | HR 63 | Temp 97.5°F | Resp 20 | Ht 66.5 in | Wt 239.0 lb

## 2019-07-09 DIAGNOSIS — M25561 Pain in right knee: Secondary | ICD-10-CM | POA: Diagnosis not present

## 2019-07-09 DIAGNOSIS — G8929 Other chronic pain: Secondary | ICD-10-CM | POA: Diagnosis not present

## 2019-07-09 MED ORDER — NAPROXEN 500 MG PO TABS: 500.0000 mg | ORAL_TABLET | Freq: Two times a day (BID) | ORAL | 0 refills | Status: DC

## 2019-07-09 MED ORDER — METHYLPREDNISOLONE ACETATE 80 MG/ML IJ SUSP
40.0000 mg | Freq: Once | INTRAMUSCULAR | Status: AC
  Administered 2019-07-09: 40 mg via INTRAMUSCULAR

## 2019-07-09 NOTE — Progress Notes (Signed)
Subjective:  Patient ID: Alyssa Mitchell, female    DOB: 1950/08/23, 69 y.o.   MRN: [id number]  Patient Care Team: [name], DO as PCP - General (Family Medicine)   Chief Complaint:  right knee pain (on going ( xray normal ) )   HPI: Alyssa Mitchell is a 69 y.o. female presenting on 07/09/2019 for right knee pain (on going ( xray normal ) )   Knee Pain  Incident onset: over 1 month ago. The incident occurred at home. The injury mechanism was a fall. The pain is present in the right knee. The quality of the pain is described as aching and shooting. The pain is at a severity of 5/10. The pain is moderate. The pain has been fluctuating since onset. Associated symptoms include an inability to bear weight. Pertinent negatives include no loss of motion, loss of sensation, muscle weakness, numbness or tingling. She reports no foreign bodies present. The symptoms are aggravated by movement, palpation and weight bearing. She has tried heat, ice, acetaminophen, elevation, NSAIDs, immobilization and non-weight bearing for the symptoms. The treatment provided mild relief.       Relevant past medical, surgical, family, and social history reviewed and updated as indicated.  Allergies and medications reviewed and updated. Date reviewed: Chart in Epic.   Past Medical History:  Diagnosis Date  . Diabetes mellitus without complication ([place])   . Hearing impaired person, bilateral    Hearing Aid left Ear  . Hypertension     Past Surgical History:  Procedure Laterality Date  . CESAREAN SECTION    . MASTECTOMY      Social History   Socioeconomic History  . Marital status: Married    Spouse name: Not on file  . Number of children: Not on file  . Years of education: Not on file  . Highest education level: Not on file  Occupational History  . Not on file  Tobacco Use  . Smoking status: Never Smoker  . Smokeless tobacco: Never Used  Substance and Sexual Activity  . Alcohol use: No  .  Drug use: No  . Sexual activity: Not on file  Other Topics Concern  . Not on file  Social History Narrative  . Not on file   Social Determinants of Health   Financial Resource Strain:   . Difficulty of Paying Living Expenses: Not on file  Food Insecurity:   . Worried About Charity fundraiser in the Last Year: Not on file  . Ran Out of Food in the Last Year: Not on file  Transportation Needs:   . Lack of Transportation (Medical): Not on file  . Lack of Transportation (Non-Medical): Not on file  Physical Activity:   . Days of Exercise per Week: Not on file  . Minutes of Exercise per Session: Not on file  Stress:   . Feeling of Stress : Not on file  Social Connections:   . Frequency of Communication with Friends and Family: Not on file  . Frequency of Social Gatherings with Friends and Family: Not on file  . Attends Religious Services: Not on file  . Active Member of Clubs or Organizations: Not on file  . Attends Archivist Meetings: Not on file  . Marital Status: Not on file  Intimate Partner Violence:   . Fear of Current or Ex-Partner: Not on file  . Emotionally Abused: Not on file  . Physically Abused: Not on file  . Sexually Abused:  Not on file    Outpatient Encounter Medications as of 07/09/2019  Medication Sig  . atenolol-chlorthalidone (TENORETIC) 50-25 MG tablet TAKE 1 TABLET BY MOUTH ONCE DAILY  . crotamiton (EURAX) 10 % cream Apply topically daily.  . diclofenac Sodium (VOLTAREN) 1 % GEL APPLY 2 GRAMS TO AFFECTED AREA 4 TIMES A DAY  . glipiZIDE (GLUCOTROL) 10 MG tablet Take 1 tablet (10 mg total) by mouth 2 (two) times daily before a meal.  . levothyroxine (SYNTHROID) 150 MCG tablet Take 1 tablet (150 mcg total) by mouth daily.  Marland Kitchen losartan (COZAAR) 100 MG tablet Take 1 tablet (100 mg total) by mouth daily.  Marland Kitchen lovastatin (MEVACOR) 20 MG tablet Take 1 tablet (20 mg total) by mouth at bedtime.  . metFORMIN (GLUCOPHAGE) 1000 MG tablet Take 1 tablet (1,000 mg  total) by mouth 2 (two) times daily with a meal.  . sitaGLIPtin (JANUVIA) 100 MG tablet Take 1 tablet (100 mg total) by mouth daily.  . triazolam (HALCION) 0.125 MG tablet Take 1 tablet (0.125 mg total) by mouth at bedtime as needed for sleep (or anxiety).  . naproxen (NAPROSYN) 500 MG tablet Take 1 tablet (500 mg total) by mouth 2 (two) times daily with a meal for 14 days.   No facility-administered encounter medications on file as of 07/09/2019.    Allergies  Allergen Reactions  . Trimox [Amoxicillin] Rash    Review of Systems  Constitutional: Negative for activity change, appetite change, chills, diaphoresis, fatigue, fever and unexpected weight change.  HENT: Positive for hearing loss.   Eyes: Negative.  Negative for photophobia and visual disturbance.  Respiratory: Negative for cough, chest tightness and shortness of breath.   Cardiovascular: Negative for chest pain, palpitations and leg swelling.  Gastrointestinal: Negative for blood in stool, constipation, diarrhea, nausea and vomiting.  Endocrine: Negative.   Genitourinary: Negative for decreased urine volume, difficulty urinating, dysuria, frequency and urgency.  Musculoskeletal: Positive for arthralgias, gait problem, joint swelling and myalgias.  Skin: Negative.   Allergic/Immunologic: Negative.   Neurological: Negative for dizziness, tingling, tremors, seizures, syncope, facial asymmetry, speech difficulty, weakness, light-headedness, numbness and headaches.  Hematological: Negative.   Psychiatric/Behavioral: Negative for confusion, hallucinations, sleep disturbance and suicidal ideas.  All other systems reviewed and are negative.       Objective:  BP (!) 176/73 (BP Location: Left Wrist, Cuff Size: Normal)   Pulse 63   Temp (!) 97.5 F (36.4 C)   Resp 20   Ht 5' 6.5" (1.689 m)   Wt 239 lb (108.4 kg)   SpO2 94%   BMI 38.00 kg/m    Wt Readings from Last 3 Encounters:  07/09/19 239 lb (108.4 kg)  06/10/19 239 lb  (108.4 kg)  06/07/19 238 lb (108 kg)    Physical Exam Vitals and nursing note reviewed.  Constitutional:      General: She is not in acute distress.    Appearance: Normal appearance. She is well-developed and well-groomed. She is obese. She is not ill-appearing, toxic-appearing or diaphoretic.  HENT:     Head: Normocephalic and atraumatic.     Jaw: There is normal jaw occlusion.     Right Ear: Decreased hearing noted.     Left Ear: Decreased hearing noted.     Nose: Nose normal.     Mouth/Throat:     Lips: Pink.     Mouth: Mucous membranes are moist.     Pharynx: Oropharynx is clear. Uvula midline.  Eyes:     General:  Lids are normal.     Extraocular Movements: Extraocular movements intact.     Conjunctiva/sclera: Conjunctivae normal.     Pupils: Pupils are equal, round, and reactive to light.  Neck:     Thyroid: No thyroid mass, thyromegaly or thyroid tenderness.     Vascular: No carotid bruit or JVD.     Trachea: Trachea and phonation normal.  Cardiovascular:     Rate and Rhythm: Normal rate and regular rhythm.     Chest Wall: PMI is not displaced.     Pulses: Normal pulses.     Heart sounds: Normal heart sounds. No murmur. No friction rub. No gallop.   Pulmonary:     Effort: Pulmonary effort is normal. No respiratory distress.     Breath sounds: Normal breath sounds. No wheezing.  Abdominal:     General: Bowel sounds are normal. There is no distension or abdominal bruit.     Palpations: Abdomen is soft. There is no hepatomegaly or splenomegaly.     Tenderness: There is no abdominal tenderness. There is no right CVA tenderness or left CVA tenderness.     Hernia: No hernia is present.  Musculoskeletal:     Cervical back: Normal range of motion and neck supple.     Right hip: Normal.     Right upper leg: Normal.     Right knee: Swelling present. No deformity, effusion, erythema, ecchymosis, lacerations, bony tenderness or crepitus. Decreased range of motion. Tenderness  present over the medial joint line. No LCL laxity, MCL laxity, ACL laxity or PCL laxity. Normal alignment, normal meniscus and normal patellar mobility. Normal pulse.     Right lower leg: Normal. No edema.     Left lower leg: No edema.  Lymphadenopathy:     Cervical: No cervical adenopathy.  Skin:    General: Skin is warm and dry.     Capillary Refill: Capillary refill takes less than 2 seconds.     Coloration: Skin is not cyanotic, jaundiced or pale.     Findings: No rash.  Neurological:     General: No focal deficit present.     Mental Status: She is alert and oriented to person, place, and time.     Cranial Nerves: Cranial nerves are intact.     Sensory: Sensation is intact.     Motor: Motor function is intact.     Coordination: Coordination is intact.     Gait: Gait abnormal (antalgic).     Deep Tendon Reflexes: Reflexes are normal and symmetric.  Psychiatric:        Attention and Perception: Attention and perception normal.        Mood and Affect: Mood and affect normal.        Speech: Speech normal.        Behavior: Behavior normal. Behavior is cooperative.        Thought Content: Thought content normal.        Cognition and Memory: Cognition and memory normal.        Judgment: Judgment normal.     Results for orders placed or performed in visit on 06/10/19  Bayer DCA Hb A1c Waived  Result Value Ref Range   HB A1C (BAYER DCA - WAIVED) 8.9 (H) <7.0 %  Thyroid Panel With TSH  Result Value Ref Range   TSH 6.100 (H) 0.450 - 4.500 uIU/mL   T4, Total 8.6 4.5 - 12.0 ug/dL   T3 Uptake Ratio 25 24 - 39 %   Free  Thyroxine Index 2.2 1.2 - 4.9  CMP14+EGFR  Result Value Ref Range   Glucose 256 (H) 65 - 99 mg/dL   BUN 15 8 - 27 mg/dL   Creatinine, Ser 0.79 0.57 - 1.00 mg/dL   GFR calc non Af Amer 77 >59 mL/min/1.73   GFR calc Af Amer 89 >59 mL/min/1.73   BUN/Creatinine Ratio 19 12 - 28   Sodium 140 134 - 144 mmol/L   Potassium 4.3 3.5 - 5.2 mmol/L   Chloride 100 96 - 106  mmol/L   CO2 25 20 - 29 mmol/L   Calcium 9.8 8.7 - 10.3 mg/dL   Total Protein 7.6 6.0 - 8.5 g/dL   Albumin 4.1 3.8 - 4.8 g/dL   Globulin, Total 3.5 1.5 - 4.5 g/dL   Albumin/Globulin Ratio 1.2 1.2 - 2.2   Bilirubin Total 0.3 0.0 - 1.2 mg/dL   Alkaline Phosphatase 99 39 - 117 IU/L   AST 21 0 - 40 IU/L   ALT 16 0 - 32 IU/L       Pertinent labs & imaging results that were available during my care of the patient were reviewed by me and considered in my medical decision making.  Assessment & Plan:  Alyssa Mitchell was seen today for right knee pain.  Diagnoses and all orders for this visit:  Chronic pain of right knee Ongoing pain despite Voltaren gel use. Will trial 14 days of oral NSAIDs. Referral to PT. If symptoms persist or worsen, will refer to ortho for further evaluation. Continue symptomatic care at home with RICE therapy.  -     Ambulatory referral to Physical Therapy -     naproxen (NAPROSYN) 500 MG tablet; Take 1 tablet (500 mg total) by mouth 2 (two) times daily with a meal for 14 days.     Continue all other maintenance medications.  Follow up plan: Return in about 6 weeks (around 08/20/2019), or if symptoms worsen or fail to improve, for PCP for knee pain.  Continue healthy lifestyle choices, including diet (rich in fruits, vegetables, and lean proteins, and low in salt and simple carbohydrates) and exercise (at least 30 minutes of moderate physical activity daily).  Educational handout given for knee pain  The above assessment and management plan was discussed with the patient. The patient verbalized understanding of and has agreed to the management plan. Patient is aware to call the clinic if they develop any new symptoms or if symptoms persist or worsen. Patient is aware when to return to the clinic for a follow-up visit. Patient educated on when it is appropriate to go to the emergency department.   Monia Pouch, FNP-C [facility] [phone]

## 2019-07-09 NOTE — Patient Instructions (Signed)

## 2019-07-19 ENCOUNTER — Other Ambulatory Visit: Payer: Self-pay | Admitting: Family Medicine

## 2019-07-19 DIAGNOSIS — G8929 Other chronic pain: Secondary | ICD-10-CM

## 2019-07-27 ENCOUNTER — Telehealth: Payer: Self-pay | Admitting: Family Medicine

## 2019-07-27 NOTE — Telephone Encounter (Signed)
Same knee pain from last visit, pt says it is no worse just has not gotten better. Offered appt, says she will call back to schedule

## 2019-08-03 ENCOUNTER — Other Ambulatory Visit: Payer: Self-pay

## 2019-08-03 ENCOUNTER — Encounter: Payer: Self-pay | Admitting: Family Medicine

## 2019-08-03 ENCOUNTER — Ambulatory Visit (INDEPENDENT_AMBULATORY_CARE_PROVIDER_SITE_OTHER): Payer: Federal, State, Local not specified - PPO | Admitting: Family Medicine

## 2019-08-03 VITALS — BP 162/88 | HR 88 | Temp 97.5°F | Ht 66.5 in | Wt 240.0 lb

## 2019-08-03 DIAGNOSIS — M7051 Other bursitis of knee, right knee: Secondary | ICD-10-CM

## 2019-08-03 DIAGNOSIS — M25561 Pain in right knee: Secondary | ICD-10-CM

## 2019-08-03 MED ORDER — NAPROXEN SODIUM 550 MG PO TABS: 550.0000 mg | ORAL_TABLET | Freq: Two times a day (BID) | ORAL | 0 refills | Status: AC

## 2019-08-03 NOTE — Progress Notes (Signed)
Subjective: CC: right knee pain PCP: Alyssa Norlander, DO [id number] Alyssa Mitchell is a 69 y.o. female presenting to clinic today for:  1. Right knee pain Patient with chronic right-sided knee pain.  She was seen about 3 weeks ago for issue.  She was placed on Naprosyn and referred to PT.  Fortunately, PT never was able to get in touch patient is was not started.  She in fact declined PT.  They have been working on her knee at home and she does note improvement in the knee pain.  She still has intermittent knee pain with certain activities.  She has been taking the Naprosyn twice daily as prescribed and still has a few tablets of this left.  She tried using a knee brace but unfortunately was unable to tolerate.  She has been using ice on the affected area.  Overall symptoms are getting better but she is not back to her baseline.   ROS: Per HPI  Allergies  Allergen Reactions  . Trimox [Amoxicillin] Rash   Past Medical History:  Diagnosis Date  . Diabetes mellitus without complication ([place])   . Hearing impaired person, bilateral    Hearing Aid left Ear  . Hypertension     Current Outpatient Medications:  .  atenolol-chlorthalidone (TENORETIC) 50-25 MG tablet, TAKE 1 TABLET BY MOUTH ONCE DAILY, Disp: 90 tablet, Rfl: 1 .  crotamiton (EURAX) 10 % cream, Apply topically daily., Disp: 60 g, Rfl: 0 .  diclofenac Sodium (VOLTAREN) 1 % GEL, APPLY 2 GRAMS TO AFFECTED AREA 4 TIMES A DAY, Disp: 300 g, Rfl: 3 .  glipiZIDE (GLUCOTROL) 10 MG tablet, Take 1 tablet (10 mg total) by mouth 2 (two) times daily before a meal., Disp: 180 tablet, Rfl: 0 .  levothyroxine (SYNTHROID) 150 MCG tablet, Take 1 tablet (150 mcg total) by mouth daily., Disp: 90 tablet, Rfl: 0 .  losartan (COZAAR) 100 MG tablet, Take 1 tablet (100 mg total) by mouth daily., Disp: 90 tablet, Rfl: 1 .  lovastatin (MEVACOR) 20 MG tablet, Take 1 tablet (20 mg total) by mouth at bedtime., Disp: 90 tablet, Rfl: 1 .  metFORMIN (GLUCOPHAGE) 1000  MG tablet, Take 1 tablet (1,000 mg total) by mouth 2 (two) times daily with a meal., Disp: 90 tablet, Rfl: 1 .  naproxen (NAPROSYN) 500 MG tablet, TAKE 1 TABLET (500 MG TOTAL) BY MOUTH 2 (TWO) TIMES DAILY WITH A MEAL FOR 14 DAYS., Disp: 28 tablet, Rfl: 0 .  sitaGLIPtin (JANUVIA) 100 MG tablet, Take 1 tablet (100 mg total) by mouth daily., Disp: 90 tablet, Rfl: 1 .  triazolam (HALCION) 0.125 MG tablet, Take 1 tablet (0.125 mg total) by mouth at bedtime as needed for sleep (or anxiety)., Disp: 30 tablet, Rfl: 2 Social History   Socioeconomic History  . Marital status: Married    Spouse name: Not on file  . Number of children: Not on file  . Years of education: Not on file  . Highest education level: Not on file  Occupational History  . Not on file  Tobacco Use  . Smoking status: Never Smoker  . Smokeless tobacco: Never Used  Substance and Sexual Activity  . Alcohol use: No  . Drug use: No  . Sexual activity: Not on file  Other Topics Concern  . Not on file  Social History Narrative  . Not on file   Social Determinants of Health   Financial Resource Strain:   . Difficulty of Paying Living Expenses:   Food  Insecurity:   . Worried About Charity fundraiser in the Last Year:   . Arboriculturist in the Last Year:   Transportation Needs:   . Film/video editor (Medical):   Marland Kitchen Lack of Transportation (Non-Medical):   Physical Activity:   . Days of Exercise per Week:   . Minutes of Exercise per Session:   Stress:   . Feeling of Stress :   Social Connections:   . Frequency of Communication with Friends and Family:   . Frequency of Social Gatherings with Friends and Family:   . Attends Religious Services:   . Active Member of Clubs or Organizations:   . Attends Archivist Meetings:   Marland Kitchen Marital Status:   Intimate Partner Violence:   . Fear of Current or Ex-Partner:   . Emotionally Abused:   Marland Kitchen Physically Abused:   . Sexually Abused:    Family History  Problem  Relation Age of Onset  . Cancer Mother   . Cancer Father   . Cancer Maternal Grandmother     Objective: Office vital signs reviewed. BP (!) 162/88 Comment: MANUAL  Pulse 88   Temp (!) 97.5 F (36.4 C) (Temporal)   Ht 5' 6.5" (1.689 m)   Wt 240 lb (108.9 kg)   SpO2 96%   BMI 38.16 kg/m   Physical Examination:  General: Awake, alert No acute distress Extremities: warm, well perfused, No edema, cyanosis or clubbing; +2 pulses bilaterally MSK: Utilizing rolling walker for ambulation.  Right knee: Patellar bursitis noted.  Mild tenderness palpation to this area.  No increased warmth, erythema.  No palpable bony abnormalities.  Assessment/ Plan: 69 y.o. female   1. Acute pain of right knee Improving but not back to baseline.  I reviewed her last office visit as well as her imaging study results which were normal.  I have changed her naproxen to Anaprox DS.  If ongoing symptoms and no total resolution, low threshold to have her evaluated by orthopedics. - naproxen sodium (ANAPROX DS) 550 MG tablet; Take 1 tablet (550 mg total) by mouth 2 (two) times daily with a meal. (if needed for pain/ swelling of knee)  Dispense: 30 tablet; Refill: 0  2. Patellar bursitis, right She had a small patellar bursitis noted today. - naproxen sodium (ANAPROX DS) 550 MG tablet; Take 1 tablet (550 mg total) by mouth 2 (two) times daily with a meal. (if needed for pain/ swelling of knee)  Dispense: 30 tablet; Refill: 0   No orders of the defined types were placed in this encounter.  No orders of the defined types were placed in this encounter.    Alyssa Norlander, DO [place] [phone]

## 2019-08-03 NOTE — Patient Instructions (Signed)
Bursitis  Bursitis is when the fluid-filled sac (bursa) that covers and protects a joint is swollen (inflamed). Bursitis is most common near joints such as the knees, elbows, hips, and shoulders. It can cause pain and stiffness. Follow these instructions at home: Medicines  Take over-the-counter and prescription medicines only as told by your doctor.  If you were prescribed an antibiotic medicine, take it as told by your doctor. Do not stop taking it even if you start to feel better. General instructions   Rest the affected area as told by your doctor. ? If you can, raise (elevate) the affected area above the level of your heart while you are sitting or lying down. ? Avoid doing things that make the pain worse.  Use a splint, brace, pad, or walking aid as told by your doctor.  If directed, put ice on the affected area: ? If you have a removable splint or brace, take it off as told by your doctor. ? Put ice in a plastic bag. ? Place a towel between your skin and the bag, or between the splint or brace and the bag. ? Leave the ice on for 20 minutes, 2-3 times a day.  Keep all follow-up visits as told by your doctor. This is important. Preventing symptoms Do these things to help you not have symptoms again:  Wear knee pads if you kneel often.  Wear sturdy running or walking shoes that fit you well.  Take a lot of breaks during activities that involve doing the same movements again and again.  Before you do any activity that takes a lot of effort, get your body ready by stretching.  Stay at a healthy weight or lose weight if your doctor says you should. If you need help doing this, ask your doctor.  Exercise often. If you start any new physical activity, do it slowly. Contact a doctor if you:  Have a fever.  Have chills.  Have symptoms that do not get better with treatment or home care. Summary  Bursitis is when the fluid-filled sac (bursa) that covers and protects a joint  is swollen.  Rest the affected area as told by your doctor.  Avoid doing things that make the pain worse.  Put ice on the affected area as told by your doctor. This information is not intended to replace advice given to you by your health care provider. Make sure you discuss any questions you have with your health care provider. Document Revised: 04/04/2017 Document Reviewed: 03/07/2017 Elsevier Patient Education  2020 [employer] Inc[employer]  

## 2019-08-20 ENCOUNTER — Other Ambulatory Visit: Payer: Self-pay | Admitting: Family Medicine

## 2019-08-20 ENCOUNTER — Telehealth (INDEPENDENT_AMBULATORY_CARE_PROVIDER_SITE_OTHER): Payer: Federal, State, Local not specified - PPO | Admitting: Family Medicine

## 2019-08-20 ENCOUNTER — Encounter: Payer: Self-pay | Admitting: Family Medicine

## 2019-08-20 ENCOUNTER — Ambulatory Visit (INDEPENDENT_AMBULATORY_CARE_PROVIDER_SITE_OTHER): Payer: Federal, State, Local not specified - PPO | Admitting: Family Medicine

## 2019-08-20 DIAGNOSIS — K047 Periapical abscess without sinus: Secondary | ICD-10-CM

## 2019-08-20 MED ORDER — CLINDAMYCIN HCL 300 MG PO CAPS: 300.0000 mg | ORAL_CAPSULE | Freq: Three times a day (TID) | ORAL | 0 refills | Status: AC

## 2019-08-20 NOTE — Progress Notes (Signed)
Virtual Visit via telephone Note  I connected with Alyssa Mitchell on 08/20/19 at 1711 by telephone and verified that I am speaking with the correct person using two identifiers. Alyssa Mitchell is currently located at home and translation for deaf service are currently with her during visit. The provider, Fransisca Kaufmann Karthikeya Funke, MD is located in their office at time of visit.  Call ended at 1717  I discussed the limitations, risks, security and privacy concerns of performing an evaluation and management service by telephone and the availability of in person appointments. I also discussed with the patient that there may be a patient responsible charge related to this service. The patient expressed understanding and agreed to proceed.   History and Present Illness: Patient is calling in today with complaints of of gum soreness and dental pain especially on the left side, she has had this previously but it is flared up again over the past week and before when she took the clindamycin it helped significantly and she would like to have it again.  No diagnosis found.  Outpatient Encounter Medications as of 08/20/2019  Medication Sig  . atenolol-chlorthalidone (TENORETIC) 50-25 MG tablet TAKE 1 TABLET BY MOUTH ONCE DAILY  . crotamiton (EURAX) 10 % cream Apply topically daily.  . diclofenac Sodium (VOLTAREN) 1 % GEL APPLY 2 GRAMS TO AFFECTED AREA 4 TIMES A DAY  . glipiZIDE (GLUCOTROL) 10 MG tablet Take 1 tablet (10 mg total) by mouth 2 (two) times daily before a meal.  . levothyroxine (SYNTHROID) 150 MCG tablet Take 1 tablet (150 mcg total) by mouth daily.  Marland Kitchen losartan (COZAAR) 100 MG tablet Take 1 tablet (100 mg total) by mouth daily.  Marland Kitchen lovastatin (MEVACOR) 20 MG tablet Take 1 tablet (20 mg total) by mouth at bedtime.  . metFORMIN (GLUCOPHAGE) 1000 MG tablet Take 1 tablet (1,000 mg total) by mouth 2 (two) times daily with a meal.  . naproxen sodium (ANAPROX DS) 550 MG tablet Take 1 tablet (550 mg total) by  mouth 2 (two) times daily with a meal. (if needed for pain/ swelling of knee)  . sitaGLIPtin (JANUVIA) 100 MG tablet Take 1 tablet (100 mg total) by mouth daily.  . triazolam (HALCION) 0.125 MG tablet Take 1 tablet (0.125 mg total) by mouth at bedtime as needed for sleep (or anxiety).   No facility-administered encounter medications on file as of 08/20/2019.    Review of Systems  Constitutional: Negative for chills and fever.  HENT: Positive for dental problem. Negative for ear discharge and ear pain.   All other systems reviewed and are negative.   Observations/Objective: Patient sounds comfortable and in no acute distress  Assessment and Plan: Problem List Items Addressed This Visit    None    Visit Diagnoses    Dental infection    -  Primary   Relevant Medications   clindamycin (CLEOCIN) 300 MG capsule      Patient feels like she is getting a dental infection and gums and she was previously treated with clindamycin it worked well, she would like to do it again. Follow up plan: No follow-ups on file.     I discussed the assessment and treatment plan with the patient. The patient was provided an opportunity to ask questions and all were answered. The patient agreed with the plan and demonstrated an understanding of the instructions.   The patient was advised to call back or seek an in-person evaluation if the symptoms worsen or if the  condition fails to improve as anticipated.  The above assessment and management plan was discussed with the patient. The patient verbalized understanding of and has agreed to the management plan. Patient is aware to call the clinic if symptoms persist or worsen. Patient is aware when to return to the clinic for a follow-up visit. Patient educated on when it is appropriate to go to the emergency department.    I provided 6 minutes of non-face-to-face time during this encounter.    Worthy Rancher, MD

## 2019-08-20 NOTE — Progress Notes (Signed)
Attempted to call patient on few different occasions, patient did not answer Alyssa Pina, MD [facility] 08/25/2019, 10:28 PM

## 2019-09-06 ENCOUNTER — Other Ambulatory Visit: Payer: Self-pay | Admitting: Family Medicine

## 2019-09-06 DIAGNOSIS — E119 Type 2 diabetes mellitus without complications: Secondary | ICD-10-CM

## 2019-09-07 ENCOUNTER — Encounter: Payer: Self-pay | Admitting: Family Medicine

## 2019-09-07 ENCOUNTER — Ambulatory Visit: Payer: Federal, State, Local not specified - PPO | Admitting: Family Medicine

## 2019-09-07 NOTE — Progress Notes (Deleted)
Subjective: CC: DM, HTN, HLD, Thyroid disorder PCP: [name], DO [id number] Alyssa Mitchell is a 69 y.o. female presenting to clinic today for:  1. Type 2 Diabetes w/ HTN, HLD  Patient reports intermittent compliance w/ Metformin BID, Januva, Glipizide.  ***  Last eye exam:needs Last foot exam: needs Last A1c:  Lab Results  Component Value Date   HGBA1C 8.9 (H) 06/10/2019   Nephropathy screen indicated?: on ARB Last flu, zoster and/or pneumovax:  Immunization History  Administered Date(s) Administered  . Pneumococcal Conjugate-13 06/27/2016    ROS: denies fever, chills, dizziness, LOC, polyuria, polydipsia, foot ulcerations, numbness or tingling in extremities or chest pain.  She reports some difficulty with losing weight but states weight is stable. ***  2. Hypothyroidism History: onset hypothyroidism since age 44.    ***She notes that the thyroid levels seem to stabilize but then got bad again several years ago.  She has been compliant with Synthroid 137 mcg daily.  Denies any difficulty swallowing, tremors, change in bowel habits, unplanned weight loss or weight gain, difficulty swallowing or heart palpitations.   ROS: Per HPI  Allergies  Allergen Reactions  . Trimox [Amoxicillin] Rash   Past Medical History:  Diagnosis Date  . Diabetes mellitus without complication ([place])   . Hearing impaired person, bilateral    Hearing Aid left Ear  . Hypertension     Current Outpatient Medications:  .  atenolol-chlorthalidone (TENORETIC) 50-25 MG tablet, TAKE 1 TABLET BY MOUTH ONCE DAILY, Disp: 90 tablet, Rfl: 1 .  clindamycin (CLEOCIN) 300 MG capsule, Take 1 capsule (300 mg total) by mouth 3 (three) times daily., Disp: 30 capsule, Rfl: 0 .  crotamiton (EURAX) 10 % cream, Apply topically daily., Disp: 60 g, Rfl: 0 .  diclofenac Sodium (VOLTAREN) 1 % GEL, APPLY 2 GRAMS TO AFFECTED AREA 4 TIMES A DAY, Disp: 300 g, Rfl: 3 .  glipiZIDE (GLUCOTROL) 10 MG tablet, Take 1 tablet (10  mg total) by mouth 2 (two) times daily before a meal., Disp: 180 tablet, Rfl: 0 .  levothyroxine (SYNTHROID) 150 MCG tablet, Take 1 tablet (150 mcg total) by mouth daily., Disp: 90 tablet, Rfl: 0 .  losartan (COZAAR) 100 MG tablet, Take 1 tablet (100 mg total) by mouth daily., Disp: 90 tablet, Rfl: 1 .  lovastatin (MEVACOR) 20 MG tablet, Take 1 tablet (20 mg total) by mouth at bedtime., Disp: 90 tablet, Rfl: 1 .  metFORMIN (GLUCOPHAGE) 1000 MG tablet, TAKE 1 TABLET (1,000 MG TOTAL) BY MOUTH 2 (TWO) TIMES DAILY WITH A MEAL., Disp: 90 tablet, Rfl: 0 .  naproxen sodium (ANAPROX DS) 550 MG tablet, Take 1 tablet (550 mg total) by mouth 2 (two) times daily with a meal. (if needed for pain/ swelling of knee), Disp: 30 tablet, Rfl: 0 .  sitaGLIPtin (JANUVIA) 100 MG tablet, Take 1 tablet (100 mg total) by mouth daily., Disp: 90 tablet, Rfl: 1 .  triazolam (HALCION) 0.125 MG tablet, Take 1 tablet (0.125 mg total) by mouth at bedtime as needed for sleep (or anxiety)., Disp: 30 tablet, Rfl: 2 Social History   Socioeconomic History  . Marital status: Married    Spouse name: Not on file  . Number of children: Not on file  . Years of education: Not on file  . Highest education level: Not on file  Occupational History  . Not on file  Tobacco Use  . Smoking status: Never Smoker  . Smokeless tobacco: Never Used  Substance and Sexual Activity  .  Alcohol use: No  . Drug use: No  . Sexual activity: Not on file  Other Topics Concern  . Not on file  Social History Narrative  . Not on file   Social Determinants of Health   Financial Resource Strain:   . Difficulty of Paying Living Expenses:   Food Insecurity:   . Worried About Charity fundraiser in the Last Year:   . Arboriculturist in the Last Year:   Transportation Needs:   . Film/video editor (Medical):   Marland Kitchen Lack of Transportation (Non-Medical):   Physical Activity:   . Days of Exercise per Week:   . Minutes of Exercise per Session:     Stress:   . Feeling of Stress :   Social Connections:   . Frequency of Communication with Friends and Family:   . Frequency of Social Gatherings with Friends and Family:   . Attends Religious Services:   . Active Member of Clubs or Organizations:   . Attends Archivist Meetings:   Marland Kitchen Marital Status:   Intimate Partner Violence:   . Fear of Current or Ex-Partner:   . Emotionally Abused:   Marland Kitchen Physically Abused:   . Sexually Abused:    Family History  Problem Relation Age of Onset  . Cancer Mother   . Cancer Father   . Cancer Maternal Grandmother     Objective: Office vital signs reviewed. There were no vitals taken for this visit.  Physical Examination:  General: Awake, alert, nontoxic, No acute distress HEENT: Normal    Neck: No masses palpated. No lymphadenopathy; no thyromegaly, goiter or thyroid masses palpable    Eyes: Sclera white.  No exophthalmos Cardio: regular rate and rhythm, S1S2 heard, no murmurs appreciated Pulm: clear to auscultation bilaterally, no wheezes, rhonchi or rales; normal work of breathing on room air Breast: left breast s/p mastectomy MSK: slow gait and hunched station Skin: dry; intact; multiple varicose veins along bilateral lower extremities.  Skin is normal temperature Neuro: No tremor.  Patient is legally deaf but can read lips and converses normally. Psych: Mood stable, pleasant and interactive. Depression screen [name] 2/9 08/03/2019 07/09/2019 06/10/2019  Decreased Interest 0 0 0  Down, Depressed, Hopeless 0 0 0  PHQ - 2 Score 0 0 0  Altered sleeping 0 - 0  Tired, decreased energy 0 - 0  Change in appetite 0 - 0  Feeling bad or failure about yourself  0 - 0  Trouble concentrating 0 - 0  Moving slowly or fidgety/restless 0 - 0  Suicidal thoughts 0 - 0  PHQ-9 Score 0 - 0   Diabetic Foot Exam - Simple   No data filed      Assessment/ Plan: 69 y.o. female   ***  No orders of the defined types were placed in this  encounter.  No orders of the defined types were placed in this encounter.    [name], DO [place] [phone]

## 2019-09-15 ENCOUNTER — Other Ambulatory Visit: Payer: Self-pay | Admitting: Family Medicine

## 2019-09-15 ENCOUNTER — Telehealth: Payer: Self-pay | Admitting: General Practice

## 2019-09-15 DIAGNOSIS — G4700 Insomnia, unspecified: Secondary | ICD-10-CM

## 2019-09-15 DIAGNOSIS — F419 Anxiety disorder, unspecified: Secondary | ICD-10-CM

## 2019-09-15 NOTE — Telephone Encounter (Signed)
  Prescription Request  09/15/2019  What is the name of the medication or equipment? triazolam (HALCION) 0.125 MG tablet    Have you contacted your pharmacy to request a refill? (if applicable) yes  Which pharmacy would you like this sent to? [facility]    Patient notified that their request is being sent to the clinical staff for review and that they should receive a response within 2 business days.

## 2019-09-16 ENCOUNTER — Other Ambulatory Visit: Payer: Self-pay | Admitting: Family Medicine

## 2019-09-16 DIAGNOSIS — G4700 Insomnia, unspecified: Secondary | ICD-10-CM

## 2019-09-16 DIAGNOSIS — F419 Anxiety disorder, unspecified: Secondary | ICD-10-CM

## 2019-09-16 MED ORDER — TRIAZOLAM 0.125 MG PO TABS: 0.1250 mg | ORAL_TABLET | Freq: Every evening | ORAL | 0 refills | Status: AC | PRN

## 2019-09-16 NOTE — Telephone Encounter (Signed)
Prescription sent to pharmacy.

## 2019-09-16 NOTE — Telephone Encounter (Signed)
Pt is deaf and had fiance, Ernie Fulk, call in again requesting refill of triazolam to [facility]. I advised them with Ms. Door consent that she was discharged from practice for no shows and non compliance with health plan. They understood. They still would like refill of this med.

## 2019-10-24 ENCOUNTER — Other Ambulatory Visit: Payer: Self-pay | Admitting: Family Medicine

## 2019-10-24 DIAGNOSIS — E119 Type 2 diabetes mellitus without complications: Secondary | ICD-10-CM

## 2019-10-31 ENCOUNTER — Other Ambulatory Visit: Payer: Self-pay | Admitting: Family Medicine

## 2019-10-31 DIAGNOSIS — K047 Periapical abscess without sinus: Secondary | ICD-10-CM

## 2019-11-11 ENCOUNTER — Other Ambulatory Visit: Payer: Self-pay | Admitting: *Deleted

## 2019-11-11 DIAGNOSIS — E785 Hyperlipidemia, unspecified: Secondary | ICD-10-CM

## 2019-11-11 DIAGNOSIS — E1159 Type 2 diabetes mellitus with other circulatory complications: Secondary | ICD-10-CM

## 2019-11-11 DIAGNOSIS — E039 Hypothyroidism, unspecified: Secondary | ICD-10-CM

## 2019-11-11 DIAGNOSIS — E119 Type 2 diabetes mellitus without complications: Secondary | ICD-10-CM

## 2019-11-18 DIAGNOSIS — E1165 Type 2 diabetes mellitus with hyperglycemia: Secondary | ICD-10-CM | POA: Diagnosis not present

## 2019-11-18 DIAGNOSIS — R011 Cardiac murmur, unspecified: Secondary | ICD-10-CM | POA: Diagnosis not present

## 2019-11-18 DIAGNOSIS — Z299 Encounter for prophylactic measures, unspecified: Secondary | ICD-10-CM | POA: Diagnosis not present

## 2019-11-18 DIAGNOSIS — E039 Hypothyroidism, unspecified: Secondary | ICD-10-CM | POA: Diagnosis not present

## 2019-11-18 DIAGNOSIS — I10 Essential (primary) hypertension: Secondary | ICD-10-CM | POA: Diagnosis not present

## 2019-11-23 DIAGNOSIS — E039 Hypothyroidism, unspecified: Secondary | ICD-10-CM | POA: Diagnosis not present

## 2019-11-23 DIAGNOSIS — I10 Essential (primary) hypertension: Secondary | ICD-10-CM | POA: Diagnosis not present

## 2019-11-23 DIAGNOSIS — E1165 Type 2 diabetes mellitus with hyperglycemia: Secondary | ICD-10-CM | POA: Diagnosis not present

## 2019-11-23 DIAGNOSIS — Z9012 Acquired absence of left breast and nipple: Secondary | ICD-10-CM | POA: Diagnosis not present

## 2019-11-23 DIAGNOSIS — Z299 Encounter for prophylactic measures, unspecified: Secondary | ICD-10-CM | POA: Diagnosis not present

## 2019-12-09 ENCOUNTER — Other Ambulatory Visit: Payer: Self-pay | Admitting: Family Medicine

## 2019-12-09 DIAGNOSIS — E785 Hyperlipidemia, unspecified: Secondary | ICD-10-CM

## 2019-12-09 DIAGNOSIS — E1159 Type 2 diabetes mellitus with other circulatory complications: Secondary | ICD-10-CM

## 2019-12-25 ENCOUNTER — Other Ambulatory Visit: Payer: Self-pay | Admitting: Family Medicine

## 2019-12-25 DIAGNOSIS — I152 Hypertension secondary to endocrine disorders: Secondary | ICD-10-CM

## 2019-12-25 DIAGNOSIS — E785 Hyperlipidemia, unspecified: Secondary | ICD-10-CM

## 2019-12-29 DIAGNOSIS — E039 Hypothyroidism, unspecified: Secondary | ICD-10-CM | POA: Diagnosis not present

## 2019-12-29 DIAGNOSIS — Z299 Encounter for prophylactic measures, unspecified: Secondary | ICD-10-CM | POA: Diagnosis not present

## 2019-12-29 DIAGNOSIS — E1165 Type 2 diabetes mellitus with hyperglycemia: Secondary | ICD-10-CM | POA: Diagnosis not present

## 2019-12-29 DIAGNOSIS — I10 Essential (primary) hypertension: Secondary | ICD-10-CM | POA: Diagnosis not present

## 2019-12-29 DIAGNOSIS — R609 Edema, unspecified: Secondary | ICD-10-CM | POA: Diagnosis not present

## 2020-01-03 DIAGNOSIS — C50912 Malignant neoplasm of unspecified site of left female breast: Secondary | ICD-10-CM | POA: Diagnosis not present

## 2020-01-17 DIAGNOSIS — R05 Cough: Secondary | ICD-10-CM | POA: Diagnosis not present

## 2020-01-17 DIAGNOSIS — Z20822 Contact with and (suspected) exposure to covid-19: Secondary | ICD-10-CM | POA: Diagnosis not present

## 2020-01-20 ENCOUNTER — Other Ambulatory Visit: Payer: Self-pay | Admitting: Family Medicine

## 2020-01-20 DIAGNOSIS — E1159 Type 2 diabetes mellitus with other circulatory complications: Secondary | ICD-10-CM

## 2020-01-21 ENCOUNTER — Other Ambulatory Visit: Payer: Self-pay | Admitting: Family Medicine

## 2020-01-21 DIAGNOSIS — E1169 Type 2 diabetes mellitus with other specified complication: Secondary | ICD-10-CM

## 2020-02-04 DIAGNOSIS — C50912 Malignant neoplasm of unspecified site of left female breast: Secondary | ICD-10-CM | POA: Diagnosis not present

## 2020-02-05 ENCOUNTER — Other Ambulatory Visit: Payer: Self-pay | Admitting: Family Medicine

## 2020-02-05 DIAGNOSIS — E1159 Type 2 diabetes mellitus with other circulatory complications: Secondary | ICD-10-CM

## 2020-02-06 ENCOUNTER — Other Ambulatory Visit: Payer: Self-pay | Admitting: Family Medicine

## 2020-02-06 DIAGNOSIS — E1169 Type 2 diabetes mellitus with other specified complication: Secondary | ICD-10-CM

## 2020-02-23 IMAGING — DX DG KNEE 1-2V*R*
2 series · 2 of 2 positions shown · non-contrast
Comparison: None.

CLINICAL DATA: Right knee pain.

EXAM:
RIGHT KNEE - 1-2 VIEW

[knee ap]
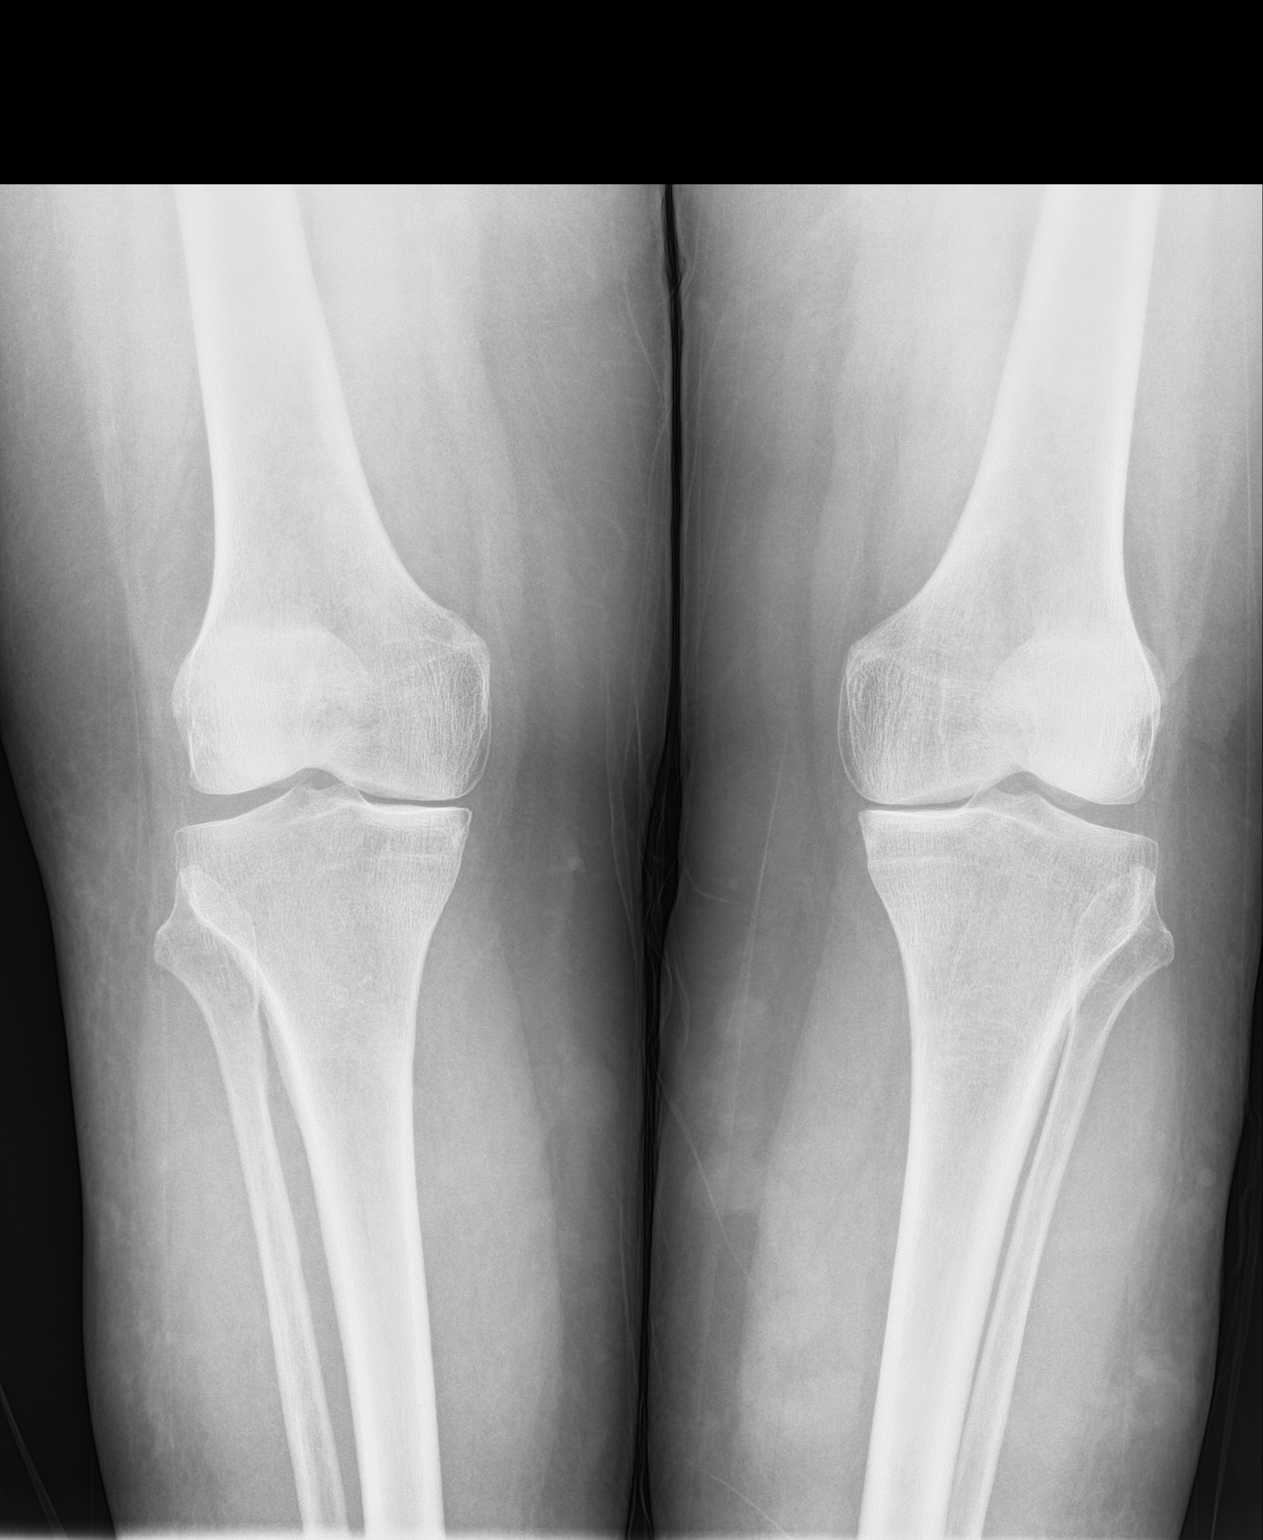

[knee lat]
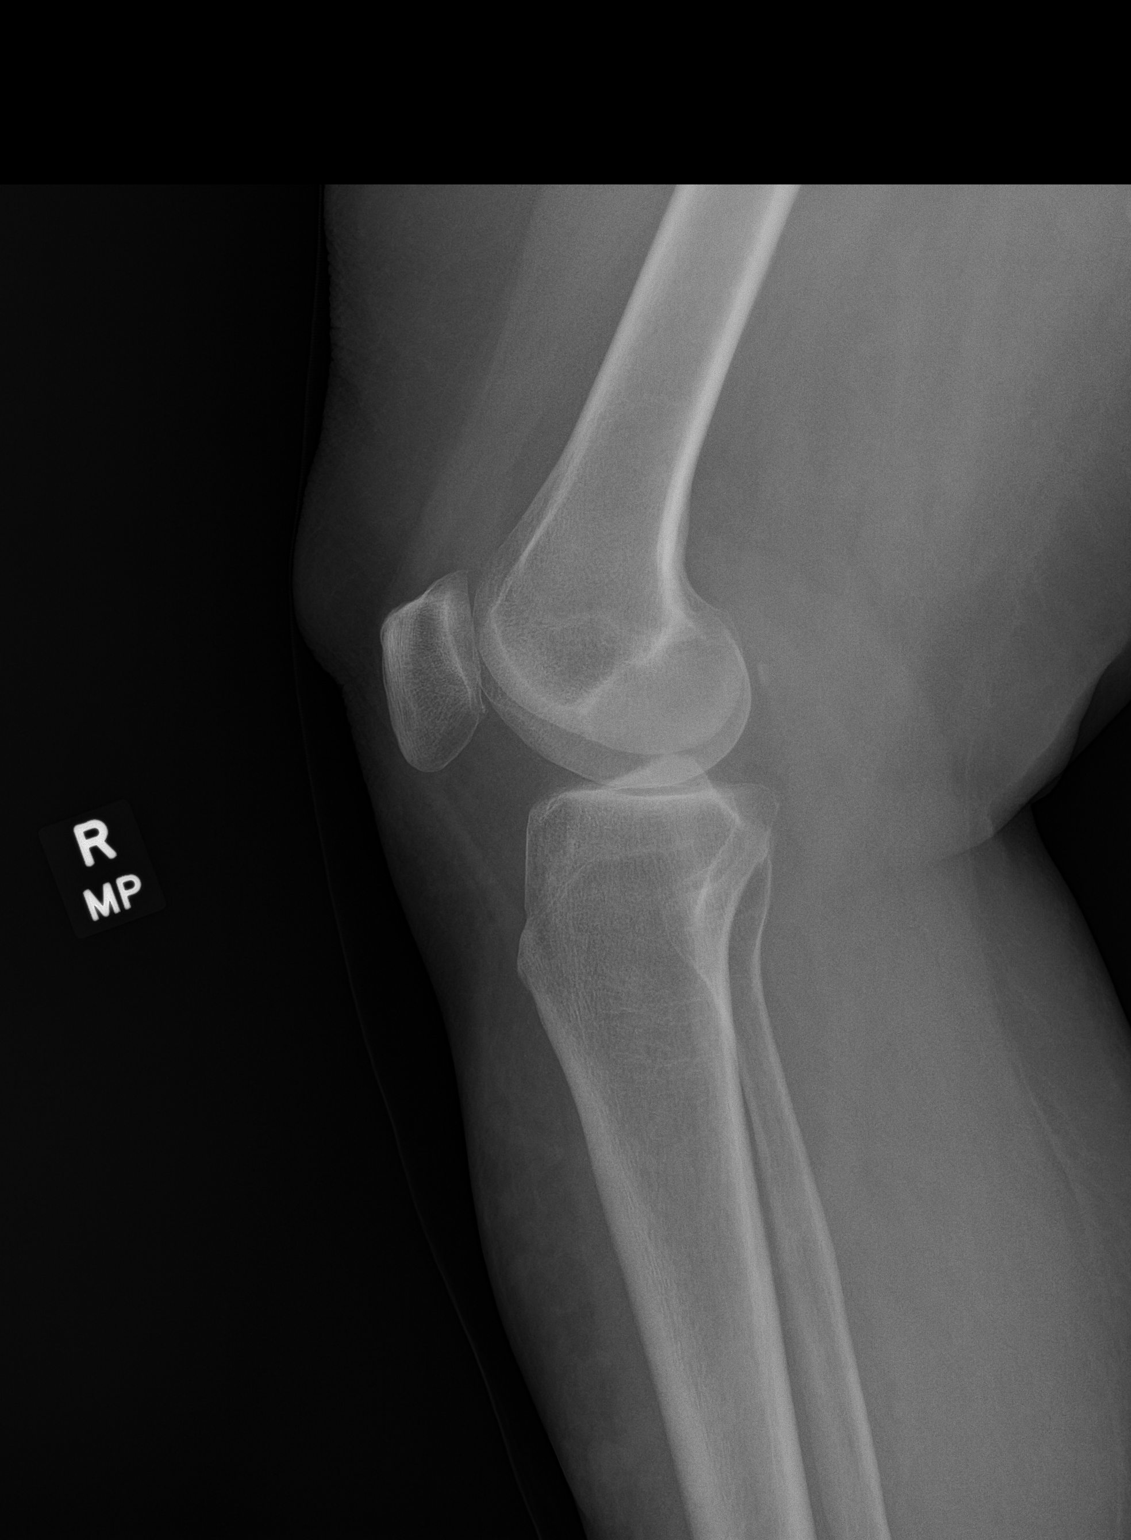

[2 of 2 positions shown; findings below may reference images not displayed]

FINDINGS: No evidence of fracture, dislocation, or joint effusion. No evidence
of arthropathy or other focal bone abnormality. Soft tissues are
unremarkable.
IMPRESSION: Negative.

## 2020-03-09 DIAGNOSIS — Z299 Encounter for prophylactic measures, unspecified: Secondary | ICD-10-CM | POA: Diagnosis not present

## 2020-03-09 DIAGNOSIS — I10 Essential (primary) hypertension: Secondary | ICD-10-CM | POA: Diagnosis not present

## 2020-03-09 DIAGNOSIS — G4700 Insomnia, unspecified: Secondary | ICD-10-CM | POA: Diagnosis not present

## 2020-03-09 DIAGNOSIS — E1165 Type 2 diabetes mellitus with hyperglycemia: Secondary | ICD-10-CM | POA: Diagnosis not present

## 2020-03-09 DIAGNOSIS — E039 Hypothyroidism, unspecified: Secondary | ICD-10-CM | POA: Diagnosis not present

## 2020-05-01 ENCOUNTER — Other Ambulatory Visit: Payer: Self-pay | Admitting: Family Medicine

## 2020-05-01 DIAGNOSIS — K047 Periapical abscess without sinus: Secondary | ICD-10-CM

## 2020-11-22 DIAGNOSIS — B078 Other viral warts: Secondary | ICD-10-CM | POA: Diagnosis not present

## 2020-11-22 DIAGNOSIS — L820 Inflamed seborrheic keratosis: Secondary | ICD-10-CM | POA: Diagnosis not present

## 2020-11-22 DIAGNOSIS — L918 Other hypertrophic disorders of the skin: Secondary | ICD-10-CM | POA: Diagnosis not present

## 2020-11-22 DIAGNOSIS — D0462 Carcinoma in situ of skin of left upper limb, including shoulder: Secondary | ICD-10-CM | POA: Diagnosis not present

## 2021-01-04 DIAGNOSIS — Z713 Dietary counseling and surveillance: Secondary | ICD-10-CM | POA: Diagnosis not present

## 2021-01-04 DIAGNOSIS — E1165 Type 2 diabetes mellitus with hyperglycemia: Secondary | ICD-10-CM | POA: Diagnosis not present

## 2021-01-04 DIAGNOSIS — Z299 Encounter for prophylactic measures, unspecified: Secondary | ICD-10-CM | POA: Diagnosis not present

## 2021-01-04 DIAGNOSIS — I10 Essential (primary) hypertension: Secondary | ICD-10-CM | POA: Diagnosis not present

## 2021-02-15 DIAGNOSIS — Z299 Encounter for prophylactic measures, unspecified: Secondary | ICD-10-CM | POA: Diagnosis not present

## 2021-02-15 DIAGNOSIS — D692 Other nonthrombocytopenic purpura: Secondary | ICD-10-CM | POA: Diagnosis not present

## 2021-02-15 DIAGNOSIS — E1165 Type 2 diabetes mellitus with hyperglycemia: Secondary | ICD-10-CM | POA: Diagnosis not present

## 2021-02-15 DIAGNOSIS — I10 Essential (primary) hypertension: Secondary | ICD-10-CM | POA: Diagnosis not present

## 2021-03-15 DIAGNOSIS — Z79899 Other long term (current) drug therapy: Secondary | ICD-10-CM | POA: Diagnosis not present

## 2021-03-15 DIAGNOSIS — Z7189 Other specified counseling: Secondary | ICD-10-CM | POA: Diagnosis not present

## 2021-03-15 DIAGNOSIS — I10 Essential (primary) hypertension: Secondary | ICD-10-CM | POA: Diagnosis not present

## 2021-03-15 DIAGNOSIS — E7800 Pure hypercholesterolemia, unspecified: Secondary | ICD-10-CM | POA: Diagnosis not present

## 2021-03-15 DIAGNOSIS — R5383 Other fatigue: Secondary | ICD-10-CM | POA: Diagnosis not present

## 2021-03-15 DIAGNOSIS — Z1211 Encounter for screening for malignant neoplasm of colon: Secondary | ICD-10-CM | POA: Diagnosis not present

## 2021-03-15 DIAGNOSIS — E039 Hypothyroidism, unspecified: Secondary | ICD-10-CM | POA: Diagnosis not present

## 2021-03-15 DIAGNOSIS — Z0000 Encounter for general adult medical examination without abnormal findings: Secondary | ICD-10-CM | POA: Diagnosis not present

## 2021-03-15 DIAGNOSIS — Z299 Encounter for prophylactic measures, unspecified: Secondary | ICD-10-CM | POA: Diagnosis not present

## 2021-03-15 DIAGNOSIS — Z789 Other specified health status: Secondary | ICD-10-CM | POA: Diagnosis not present

## 2021-04-19 ENCOUNTER — Other Ambulatory Visit: Payer: Self-pay | Admitting: Family Medicine

## 2021-04-19 DIAGNOSIS — E119 Type 2 diabetes mellitus without complications: Secondary | ICD-10-CM

## 2021-05-08 DIAGNOSIS — Z299 Encounter for prophylactic measures, unspecified: Secondary | ICD-10-CM | POA: Diagnosis not present

## 2021-05-08 DIAGNOSIS — Z789 Other specified health status: Secondary | ICD-10-CM | POA: Diagnosis not present

## 2021-05-08 DIAGNOSIS — J069 Acute upper respiratory infection, unspecified: Secondary | ICD-10-CM | POA: Diagnosis not present

## 2021-05-08 DIAGNOSIS — E1165 Type 2 diabetes mellitus with hyperglycemia: Secondary | ICD-10-CM | POA: Diagnosis not present

## 2021-05-08 DIAGNOSIS — I10 Essential (primary) hypertension: Secondary | ICD-10-CM | POA: Diagnosis not present

## 2021-05-08 DIAGNOSIS — G4700 Insomnia, unspecified: Secondary | ICD-10-CM | POA: Diagnosis not present

## 2021-05-11 DIAGNOSIS — E1165 Type 2 diabetes mellitus with hyperglycemia: Secondary | ICD-10-CM | POA: Diagnosis not present

## 2021-05-11 DIAGNOSIS — Z789 Other specified health status: Secondary | ICD-10-CM | POA: Diagnosis not present

## 2021-05-11 DIAGNOSIS — J069 Acute upper respiratory infection, unspecified: Secondary | ICD-10-CM | POA: Diagnosis not present

## 2021-05-11 DIAGNOSIS — Z299 Encounter for prophylactic measures, unspecified: Secondary | ICD-10-CM | POA: Diagnosis not present

## 2021-05-11 DIAGNOSIS — I10 Essential (primary) hypertension: Secondary | ICD-10-CM | POA: Diagnosis not present

## 2021-06-18 DIAGNOSIS — E1165 Type 2 diabetes mellitus with hyperglycemia: Secondary | ICD-10-CM | POA: Diagnosis not present

## 2021-06-18 DIAGNOSIS — G4700 Insomnia, unspecified: Secondary | ICD-10-CM | POA: Diagnosis not present

## 2021-06-18 DIAGNOSIS — I10 Essential (primary) hypertension: Secondary | ICD-10-CM | POA: Diagnosis not present

## 2021-06-18 DIAGNOSIS — M542 Cervicalgia: Secondary | ICD-10-CM | POA: Diagnosis not present

## 2021-06-18 DIAGNOSIS — N390 Urinary tract infection, site not specified: Secondary | ICD-10-CM | POA: Diagnosis not present

## 2021-06-18 DIAGNOSIS — Z299 Encounter for prophylactic measures, unspecified: Secondary | ICD-10-CM | POA: Diagnosis not present

## 2021-06-21 DIAGNOSIS — L821 Other seborrheic keratosis: Secondary | ICD-10-CM | POA: Diagnosis not present

## 2021-06-21 DIAGNOSIS — L918 Other hypertrophic disorders of the skin: Secondary | ICD-10-CM | POA: Diagnosis not present

## 2021-06-21 DIAGNOSIS — B078 Other viral warts: Secondary | ICD-10-CM | POA: Diagnosis not present

## 2021-06-21 DIAGNOSIS — L570 Actinic keratosis: Secondary | ICD-10-CM | POA: Diagnosis not present

## 2021-06-21 DIAGNOSIS — X32XXXD Exposure to sunlight, subsequent encounter: Secondary | ICD-10-CM | POA: Diagnosis not present

## 2021-08-23 ENCOUNTER — Other Ambulatory Visit: Payer: Self-pay | Admitting: Family Medicine

## 2021-08-23 DIAGNOSIS — E1159 Type 2 diabetes mellitus with other circulatory complications: Secondary | ICD-10-CM

## 2021-08-27 ENCOUNTER — Other Ambulatory Visit: Payer: Self-pay | Admitting: Family Medicine

## 2021-08-27 DIAGNOSIS — E1159 Type 2 diabetes mellitus with other circulatory complications: Secondary | ICD-10-CM

## 2021-09-18 DIAGNOSIS — Z713 Dietary counseling and surveillance: Secondary | ICD-10-CM | POA: Diagnosis not present

## 2021-09-18 DIAGNOSIS — E1165 Type 2 diabetes mellitus with hyperglycemia: Secondary | ICD-10-CM | POA: Diagnosis not present

## 2021-09-18 DIAGNOSIS — Z299 Encounter for prophylactic measures, unspecified: Secondary | ICD-10-CM | POA: Diagnosis not present

## 2021-09-18 DIAGNOSIS — I10 Essential (primary) hypertension: Secondary | ICD-10-CM | POA: Diagnosis not present

## 2021-12-10 DIAGNOSIS — E119 Type 2 diabetes mellitus without complications: Secondary | ICD-10-CM | POA: Diagnosis not present

## 2021-12-25 DIAGNOSIS — I10 Essential (primary) hypertension: Secondary | ICD-10-CM | POA: Diagnosis not present

## 2021-12-25 DIAGNOSIS — E1165 Type 2 diabetes mellitus with hyperglycemia: Secondary | ICD-10-CM | POA: Diagnosis not present

## 2021-12-25 DIAGNOSIS — D492 Neoplasm of unspecified behavior of bone, soft tissue, and skin: Secondary | ICD-10-CM | POA: Diagnosis not present

## 2021-12-25 DIAGNOSIS — E7800 Pure hypercholesterolemia, unspecified: Secondary | ICD-10-CM | POA: Diagnosis not present

## 2021-12-25 DIAGNOSIS — Z299 Encounter for prophylactic measures, unspecified: Secondary | ICD-10-CM | POA: Diagnosis not present

## 2022-01-02 DIAGNOSIS — Z299 Encounter for prophylactic measures, unspecified: Secondary | ICD-10-CM | POA: Diagnosis not present

## 2022-01-02 DIAGNOSIS — H9193 Unspecified hearing loss, bilateral: Secondary | ICD-10-CM | POA: Diagnosis not present

## 2022-01-02 DIAGNOSIS — Z20822 Contact with and (suspected) exposure to covid-19: Secondary | ICD-10-CM | POA: Diagnosis not present

## 2022-01-02 DIAGNOSIS — I10 Essential (primary) hypertension: Secondary | ICD-10-CM | POA: Diagnosis not present

## 2022-01-02 DIAGNOSIS — J069 Acute upper respiratory infection, unspecified: Secondary | ICD-10-CM | POA: Diagnosis not present

## 2022-01-16 DIAGNOSIS — I10 Essential (primary) hypertension: Secondary | ICD-10-CM | POA: Diagnosis not present

## 2022-01-16 DIAGNOSIS — Z299 Encounter for prophylactic measures, unspecified: Secondary | ICD-10-CM | POA: Diagnosis not present

## 2022-01-16 DIAGNOSIS — J069 Acute upper respiratory infection, unspecified: Secondary | ICD-10-CM | POA: Diagnosis not present

## 2022-01-16 DIAGNOSIS — R059 Cough, unspecified: Secondary | ICD-10-CM | POA: Diagnosis not present

## 2022-02-19 DIAGNOSIS — Z0000 Encounter for general adult medical examination without abnormal findings: Secondary | ICD-10-CM | POA: Diagnosis not present

## 2022-02-19 DIAGNOSIS — Z299 Encounter for prophylactic measures, unspecified: Secondary | ICD-10-CM | POA: Diagnosis not present

## 2022-02-19 DIAGNOSIS — I10 Essential (primary) hypertension: Secondary | ICD-10-CM | POA: Diagnosis not present

## 2022-02-19 DIAGNOSIS — Z5320 Procedure and treatment not carried out because of patient's decision for unspecified reasons: Secondary | ICD-10-CM | POA: Diagnosis not present

## 2022-02-19 DIAGNOSIS — R350 Frequency of micturition: Secondary | ICD-10-CM | POA: Diagnosis not present

## 2022-02-19 DIAGNOSIS — Z7189 Other specified counseling: Secondary | ICD-10-CM | POA: Diagnosis not present

## 2022-03-08 LAB — COLOGUARD

## 2022-03-11 DIAGNOSIS — L821 Other seborrheic keratosis: Secondary | ICD-10-CM | POA: Diagnosis not present

## 2022-03-11 DIAGNOSIS — L03011 Cellulitis of right finger: Secondary | ICD-10-CM | POA: Diagnosis not present

## 2022-03-22 DIAGNOSIS — Z789 Other specified health status: Secondary | ICD-10-CM | POA: Diagnosis not present

## 2022-03-22 DIAGNOSIS — E1165 Type 2 diabetes mellitus with hyperglycemia: Secondary | ICD-10-CM | POA: Diagnosis not present

## 2022-03-22 DIAGNOSIS — Z0000 Encounter for general adult medical examination without abnormal findings: Secondary | ICD-10-CM | POA: Diagnosis not present

## 2022-03-22 DIAGNOSIS — I10 Essential (primary) hypertension: Secondary | ICD-10-CM | POA: Diagnosis not present

## 2022-03-22 DIAGNOSIS — E7800 Pure hypercholesterolemia, unspecified: Secondary | ICD-10-CM | POA: Diagnosis not present

## 2022-03-22 DIAGNOSIS — Z299 Encounter for prophylactic measures, unspecified: Secondary | ICD-10-CM | POA: Diagnosis not present

## 2022-03-22 DIAGNOSIS — E039 Hypothyroidism, unspecified: Secondary | ICD-10-CM | POA: Diagnosis not present

## 2022-04-04 DIAGNOSIS — L570 Actinic keratosis: Secondary | ICD-10-CM | POA: Diagnosis not present

## 2022-04-04 DIAGNOSIS — D0461 Carcinoma in situ of skin of right upper limb, including shoulder: Secondary | ICD-10-CM | POA: Diagnosis not present

## 2022-04-04 DIAGNOSIS — L03011 Cellulitis of right finger: Secondary | ICD-10-CM | POA: Diagnosis not present

## 2022-04-04 DIAGNOSIS — X32XXXD Exposure to sunlight, subsequent encounter: Secondary | ICD-10-CM | POA: Diagnosis not present

## 2022-04-30 DIAGNOSIS — E1165 Type 2 diabetes mellitus with hyperglycemia: Secondary | ICD-10-CM | POA: Diagnosis not present

## 2022-04-30 DIAGNOSIS — Z299 Encounter for prophylactic measures, unspecified: Secondary | ICD-10-CM | POA: Diagnosis not present

## 2022-04-30 DIAGNOSIS — I10 Essential (primary) hypertension: Secondary | ICD-10-CM | POA: Diagnosis not present

## 2022-06-07 DIAGNOSIS — Z299 Encounter for prophylactic measures, unspecified: Secondary | ICD-10-CM | POA: Diagnosis not present

## 2022-06-07 DIAGNOSIS — E1165 Type 2 diabetes mellitus with hyperglycemia: Secondary | ICD-10-CM | POA: Diagnosis not present

## 2022-06-07 DIAGNOSIS — D692 Other nonthrombocytopenic purpura: Secondary | ICD-10-CM | POA: Diagnosis not present

## 2022-06-07 DIAGNOSIS — J069 Acute upper respiratory infection, unspecified: Secondary | ICD-10-CM | POA: Diagnosis not present

## 2022-06-13 DIAGNOSIS — Z85828 Personal history of other malignant neoplasm of skin: Secondary | ICD-10-CM | POA: Diagnosis not present

## 2022-06-13 DIAGNOSIS — L570 Actinic keratosis: Secondary | ICD-10-CM | POA: Diagnosis not present

## 2022-06-13 DIAGNOSIS — Z08 Encounter for follow-up examination after completed treatment for malignant neoplasm: Secondary | ICD-10-CM | POA: Diagnosis not present

## 2022-06-13 DIAGNOSIS — X32XXXD Exposure to sunlight, subsequent encounter: Secondary | ICD-10-CM | POA: Diagnosis not present

## 2022-07-05 DIAGNOSIS — E1165 Type 2 diabetes mellitus with hyperglycemia: Secondary | ICD-10-CM | POA: Diagnosis not present

## 2022-07-05 DIAGNOSIS — Z299 Encounter for prophylactic measures, unspecified: Secondary | ICD-10-CM | POA: Diagnosis not present

## 2022-07-05 DIAGNOSIS — I10 Essential (primary) hypertension: Secondary | ICD-10-CM | POA: Diagnosis not present

## 2022-07-05 DIAGNOSIS — B379 Candidiasis, unspecified: Secondary | ICD-10-CM | POA: Diagnosis not present

## 2022-07-05 DIAGNOSIS — R350 Frequency of micturition: Secondary | ICD-10-CM | POA: Diagnosis not present

## 2022-08-21 DIAGNOSIS — Z299 Encounter for prophylactic measures, unspecified: Secondary | ICD-10-CM | POA: Diagnosis not present

## 2022-08-21 DIAGNOSIS — I10 Essential (primary) hypertension: Secondary | ICD-10-CM | POA: Diagnosis not present

## 2022-08-21 DIAGNOSIS — E1165 Type 2 diabetes mellitus with hyperglycemia: Secondary | ICD-10-CM | POA: Diagnosis not present

## 2022-10-03 DIAGNOSIS — I10 Essential (primary) hypertension: Secondary | ICD-10-CM | POA: Diagnosis not present

## 2022-10-03 DIAGNOSIS — G4700 Insomnia, unspecified: Secondary | ICD-10-CM | POA: Diagnosis not present

## 2022-10-03 DIAGNOSIS — Z299 Encounter for prophylactic measures, unspecified: Secondary | ICD-10-CM | POA: Diagnosis not present

## 2022-10-03 DIAGNOSIS — E1165 Type 2 diabetes mellitus with hyperglycemia: Secondary | ICD-10-CM | POA: Diagnosis not present

## 2022-10-03 DIAGNOSIS — N390 Urinary tract infection, site not specified: Secondary | ICD-10-CM | POA: Diagnosis not present

## 2022-10-03 DIAGNOSIS — R300 Dysuria: Secondary | ICD-10-CM | POA: Diagnosis not present

## 2022-12-06 DIAGNOSIS — I10 Essential (primary) hypertension: Secondary | ICD-10-CM | POA: Diagnosis not present

## 2022-12-06 DIAGNOSIS — E1165 Type 2 diabetes mellitus with hyperglycemia: Secondary | ICD-10-CM | POA: Diagnosis not present

## 2022-12-06 DIAGNOSIS — Z299 Encounter for prophylactic measures, unspecified: Secondary | ICD-10-CM | POA: Diagnosis not present

## 2023-06-25 ENCOUNTER — Telehealth: Payer: Self-pay

## 2023-06-25 NOTE — Telephone Encounter (Signed)
Copied from CRM [id number]. Topic: Appointments - Appointment Scheduling >> Jun 25, 2023 11:21 AM Ivette P wrote: Patient/patient representative is calling to schedule an appointment. Refer to attachments for appointment information.   Pt is discharged from practice and would like to speak to someone about being discharged and getting it lifted. Pt callback [phone]

## 2023-06-26 NOTE — Telephone Encounter (Signed)
It looks like patient was previously dismissed from Dr. Nadine Counts as her PCP, she would know the reason for dismissal and would know whether or not this patient would be a good candidate to bring back into the practice?

## 2023-06-27 NOTE — Telephone Encounter (Signed)
Knowing this, I do not know that any other providers would accept her as a patient back here but you may have presented to all of the providers accepting patients with this reasoning and see if anybody would take her on

## 2023-06-27 NOTE — Telephone Encounter (Signed)
She was dismissed due to noncompliance with therapies/ follow up.  It would be provider dependent if they wanted to take her on as a patient with this history.  I am not accepting patients at this time.

## 2023-06-27 NOTE — Telephone Encounter (Signed)
Once Alyssa Mitchell responds patient will be contacted

## 2023-06-27 NOTE — Telephone Encounter (Signed)
I decline

## 2023-06-30 NOTE — Telephone Encounter (Signed)
Made patient's husband aware.

## 2023-07-22 ENCOUNTER — Ambulatory Visit: Payer: Medicare Other | Admitting: Nurse Practitioner

## 2023-12-05 ENCOUNTER — Other Ambulatory Visit (HOSPITAL_COMMUNITY): Payer: Self-pay | Admitting: Internal Medicine

## 2023-12-05 DIAGNOSIS — I1A0 Resistant hypertension: Secondary | ICD-10-CM

## 2024-04-26 ENCOUNTER — Ambulatory Visit (HOSPITAL_COMMUNITY)

## 2024-05-12 ENCOUNTER — Ambulatory Visit (HOSPITAL_COMMUNITY)
Admission: RE | Admit: 2024-05-12 | Discharge: 2024-05-12 | Disposition: A | Discharge status: Home / Self Care | Source: Ambulatory Visit | Attending: Internal Medicine | Admitting: Internal Medicine

## 2024-05-12 ENCOUNTER — Encounter (HOSPITAL_COMMUNITY): Payer: Self-pay

## 2024-05-12 DIAGNOSIS — I1A0 Resistant hypertension: Secondary | ICD-10-CM | POA: Insufficient documentation

## 2024-05-14 ENCOUNTER — Ambulatory Visit (HOSPITAL_COMMUNITY)
